# Patient Record
Sex: Male | Born: 1945 | Race: White | Hispanic: No | Marital: Married | Smoking: Never smoker
Health system: Southern US, Community
[De-identification: ages and names within clinical notes are randomized; demographics above are authoritative.]

## PROBLEM LIST (undated history)

## (undated) DIAGNOSIS — R569 Unspecified convulsions: Secondary | ICD-10-CM

## (undated) DIAGNOSIS — C801 Malignant (primary) neoplasm, unspecified: Secondary | ICD-10-CM

## (undated) DIAGNOSIS — I2510 Atherosclerotic heart disease of native coronary artery without angina pectoris: Secondary | ICD-10-CM

## (undated) DIAGNOSIS — E785 Hyperlipidemia, unspecified: Secondary | ICD-10-CM

## (undated) DIAGNOSIS — Z9289 Personal history of other medical treatment: Secondary | ICD-10-CM

## (undated) DIAGNOSIS — Z86018 Personal history of other benign neoplasm: Secondary | ICD-10-CM

## (undated) DIAGNOSIS — I10 Essential (primary) hypertension: Secondary | ICD-10-CM

## (undated) HISTORY — PX: TONSILLECTOMY AND ADENOIDECTOMY: SUR1326

## (undated) HISTORY — PX: NASAL FRACTURE SURGERY: SHX718

## (undated) HISTORY — PX: BRAIN MENINGIOMA EXCISION: SHX576

## (undated) HISTORY — DX: Essential (primary) hypertension: I10

## (undated) HISTORY — PX: NASAL SINUS SURGERY: SHX719

## (undated) HISTORY — DX: Personal history of other benign neoplasm: Z86.018

## (undated) HISTORY — DX: Atherosclerotic heart disease of native coronary artery without angina pectoris: I25.10

## (undated) HISTORY — DX: Hyperlipidemia, unspecified: E78.5

## (undated) HISTORY — PX: FRACTURE SURGERY: SHX138

---

## 2001-02-12 ENCOUNTER — Observation Stay (HOSPITAL_COMMUNITY): Admission: EM | Admit: 2001-02-12 | Discharge: 2001-02-13 | Payer: Self-pay | Admitting: Emergency Medicine

## 2001-02-12 ENCOUNTER — Encounter: Payer: Self-pay | Admitting: Cardiovascular Disease

## 2001-02-15 ENCOUNTER — Inpatient Hospital Stay (HOSPITAL_COMMUNITY): Admission: EM | Admit: 2001-02-15 | Discharge: 2001-02-16 | Payer: Self-pay | Admitting: Emergency Medicine

## 2001-02-15 ENCOUNTER — Encounter: Payer: Self-pay | Admitting: Cardiovascular Disease

## 2004-01-08 ENCOUNTER — Observation Stay (HOSPITAL_COMMUNITY): Admission: EM | Admit: 2004-01-08 | Discharge: 2004-01-09 | Payer: Self-pay | Admitting: Emergency Medicine

## 2004-01-17 DIAGNOSIS — Z9289 Personal history of other medical treatment: Secondary | ICD-10-CM

## 2004-01-17 HISTORY — DX: Personal history of other medical treatment: Z92.89

## 2006-01-02 ENCOUNTER — Ambulatory Visit: Payer: Self-pay | Admitting: Specialist

## 2006-01-16 HISTORY — PX: COLONOSCOPY: SHX174

## 2006-06-26 ENCOUNTER — Ambulatory Visit: Payer: Self-pay | Admitting: Family Medicine

## 2006-07-30 ENCOUNTER — Ambulatory Visit: Payer: Self-pay | Admitting: Family Medicine

## 2006-08-01 ENCOUNTER — Ambulatory Visit: Payer: Self-pay | Admitting: Internal Medicine

## 2006-08-15 ENCOUNTER — Ambulatory Visit: Payer: Self-pay | Admitting: Internal Medicine

## 2007-04-17 ENCOUNTER — Inpatient Hospital Stay (HOSPITAL_COMMUNITY): Admission: EM | Admit: 2007-04-17 | Discharge: 2007-04-18 | Payer: Self-pay | Admitting: Emergency Medicine

## 2007-04-22 ENCOUNTER — Ambulatory Visit: Payer: Self-pay | Admitting: Family Medicine

## 2007-11-26 ENCOUNTER — Ambulatory Visit: Payer: Self-pay | Admitting: Family Medicine

## 2008-03-26 ENCOUNTER — Ambulatory Visit: Payer: Self-pay | Admitting: Family Medicine

## 2008-03-30 ENCOUNTER — Ambulatory Visit: Payer: Self-pay | Admitting: Family Medicine

## 2009-03-19 ENCOUNTER — Ambulatory Visit: Payer: Self-pay | Admitting: Family Medicine

## 2009-04-30 ENCOUNTER — Ambulatory Visit: Payer: Self-pay | Admitting: Family Medicine

## 2009-08-30 ENCOUNTER — Ambulatory Visit: Payer: Self-pay | Admitting: Family Medicine

## 2009-12-18 ENCOUNTER — Inpatient Hospital Stay (HOSPITAL_COMMUNITY): Admission: EM | Admit: 2009-12-18 | Discharge: 2009-12-19 | Payer: Self-pay | Admitting: Emergency Medicine

## 2010-02-08 ENCOUNTER — Ambulatory Visit
Admission: RE | Admit: 2010-02-08 | Discharge: 2010-02-08 | Payer: Self-pay | Source: Home / Self Care | Attending: Family Medicine | Admitting: Family Medicine

## 2010-02-22 ENCOUNTER — Observation Stay (HOSPITAL_COMMUNITY)
Admission: EM | Admit: 2010-02-22 | Discharge: 2010-02-24 | DRG: 101 | Disposition: A | Discharge status: Home / Self Care | Payer: 59 | Attending: Internal Medicine | Admitting: Internal Medicine

## 2010-02-22 ENCOUNTER — Emergency Department (HOSPITAL_COMMUNITY): Payer: 59

## 2010-02-22 DIAGNOSIS — E871 Hypo-osmolality and hyponatremia: Secondary | ICD-10-CM | POA: Diagnosis present

## 2010-02-22 DIAGNOSIS — I10 Essential (primary) hypertension: Secondary | ICD-10-CM | POA: Diagnosis present

## 2010-02-22 DIAGNOSIS — R279 Unspecified lack of coordination: Secondary | ICD-10-CM | POA: Diagnosis present

## 2010-02-22 DIAGNOSIS — T502X5A Adverse effect of carbonic-anhydrase inhibitors, benzothiadiazides and other diuretics, initial encounter: Secondary | ICD-10-CM | POA: Diagnosis present

## 2010-02-22 DIAGNOSIS — T420X5A Adverse effect of hydantoin derivatives, initial encounter: Secondary | ICD-10-CM | POA: Diagnosis present

## 2010-02-22 DIAGNOSIS — G40909 Epilepsy, unspecified, not intractable, without status epilepticus: Principal | ICD-10-CM | POA: Diagnosis present

## 2010-02-22 LAB — CBC
HCT: 42.9 % (ref 39.0–52.0)
MCHC: 35.9 g/dL (ref 30.0–36.0)
MCV: 82 fL (ref 78.0–100.0)
Platelets: 213 10*3/uL (ref 150–400)
RDW: 13.6 % (ref 11.5–15.5)

## 2010-02-22 LAB — BASIC METABOLIC PANEL
BUN: 6 mg/dL (ref 6–23)
Creatinine, Ser: 0.68 mg/dL (ref 0.4–1.5)
GFR calc non Af Amer: >60 mL/min (ref >60)
Glucose, Bld: 111 mg/dL — ABNORMAL HIGH (ref 70–99)
Sodium: 130 mEq/L — ABNORMAL LOW (ref 135–145)

## 2010-02-22 LAB — PHENYTOIN LEVEL, TOTAL: Phenytoin Lvl: 9.1 ug/mL — ABNORMAL LOW (ref 10.0–20.0)

## 2010-02-23 LAB — POCT I-STAT, CHEM 8
HCT: 48 % (ref 39.0–52.0)
Hemoglobin: 16.3 g/dL (ref 13.0–17.0)

## 2010-02-24 LAB — BASIC METABOLIC PANEL
BUN: 5 mg/dL — ABNORMAL LOW (ref 6–23)
Potassium: 4.1 mEq/L (ref 3.5–5.1)
Sodium: 135 mEq/L (ref 135–145)

## 2010-02-27 NOTE — H&P (Signed)
NAMEFATE, CASTER NO.:  [id number]  MEDICAL RECORD NO.:  [id number]           PATIENT TYPE:  E  LOCATION:  [facility]                         FACILITY:  [facility]  PHYSICIAN:  Houston Siren, MD           DATE OF BIRTH:  October 09, 1945  DATE OF ADMISSION:  02/22/2010 DATE OF DISCHARGE:                             HISTORY & PHYSICAL   PRIMARY CARE PHYSICIAN:  Sharlot Gowda, MD.  ADVANCE DIRECTIVE:  Full code.  REASON FOR ADMISSION:  Ataxia.  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  He reportedly had a brief shaking movement without loss of consciousness, but subsequently was confused and somnolent.  He stated these brief episodes are what he had in the past and was thought to be seizure, although his prior physician never told him that these were true seizures. Recently, his Dilantin dose was reduced.  Workup in the emergency room included a Dilantin level of 9.1, and a serum sodium of 130 with normal creatinine and normal white count.  His head CT shows stable post craniotomy compared to December 2011, along with evidence of chronic sinusitis.  He felt no vertigo.  Attempting to go home, however, he felt weak and ataxic when he tried to ambulate.  Because of his unsteadiness, Hospitalist was asked to admit the patient for observation and to be sure he does not have any fall risk.  PAST MEDICAL HISTORY: 1. Hypertension. 2. Brain tumor. 3. Seizure disorder status post craniotomy.  SOCIAL HISTORY:  He lives with his wife.  He is not a smoker and denies any alcohol or drug use.  ALLERGIES:  Reglan.  CURRENT MEDICATIONS:  Dilantin, hydrochlorothiazide, and Toprol-XL.  REVIEW OF SYSTEMS:  Otherwise unremarkable.  He has no headache  or visual problem.  He complained of problem judging distance.  PHYSICAL EXAMINATION:  VITAL SIGNS:  Blood pressure 160/100, pulse is 74, respiratory rate of 18, temperature 98.2. HEENT:  He is an alert and oriented and conversing.  His speech is fluent.  Tongue is midline.  He has facial symmetry. NECK:  Supple.  No carotid bruit. CARDIAC:  S1 and S2, regular.  I did not hear murmur, rub, or gallop. LUNGS:  Clear. NEUROLOGIC:  He has good strength bilaterally.  Cerebellum exam is intact.  He is able to stand and ambulate for me without assistance, however, he does appear slightly ataxic.  His strength is equal bilaterally and he has no cogwheel rigidities.  I did not witness any 3- 5 Hz tremor.  OBJECTIVE FINDINGS:  Serum sodium 130, glucose 111, creatinine 0.68. Dilantin 9.1.  White count of 8,700, hemoglobin of 15.4.  Head CT showed no changes since December 18, 2009, with evidence of chronic sinusitis.  IMPRESSION:  This is a 65 year old male with history of brain tumor status post craniotomy along with history of seizure on subtherapeutic Dilantin level.  PLAN:  I will increase his Dilantin to 250 mg b.i.d., although I am not convinced that these were real seizures.  His degree of hyponatremia of 130 is unlikely to cause ataxia, and it is most likely because of the hydrochlorothiazide.  We will hold diuretic and give normal saline at 75 mL an hour.  I suspect that with this, his serum sodium will correct.  We would like to get physical therapy to evaluate the safety of his gait.  We will admit him to the [facility].  He is a full code.     Houston Siren, MD     PL/MEDQ  D:  02/23/2010  T:  02/23/2010  Job:  [id number]  Electronically Signed by Houston Siren  on 02/27/2010 05:29:54 AM

## 2010-03-28 LAB — URINALYSIS, ROUTINE W REFLEX MICROSCOPIC
Bilirubin Urine: NEGATIVE
Hgb urine dipstick: NEGATIVE
Ketones, ur: NEGATIVE mg/dL
Protein, ur: NEGATIVE mg/dL
Urobilinogen, UA: 0.2 mg/dL (ref 0.0–1.0)
pH: 7.5 (ref 5.0–8.0)

## 2010-03-28 LAB — POCT I-STAT, CHEM 8
Calcium, Ion: 1.12 mmol/L (ref 1.12–1.32)
Chloride: 96 mEq/L (ref 96–112)
Potassium: 3.4 mEq/L — ABNORMAL LOW (ref 3.5–5.1)
Sodium: 133 mEq/L — ABNORMAL LOW (ref 135–145)

## 2010-03-28 LAB — POCT CARDIAC MARKERS
CKMB, poc: 6.1 ng/mL (ref 1.0–8.0)
CKMB, poc: 9.8 ng/mL (ref 1.0–8.0)
Troponin i, poc: <0.05 ng/mL (ref 0.00–0.09)
Troponin i, poc: <0.05 ng/mL (ref 0.00–0.09)

## 2010-03-28 LAB — DIFFERENTIAL
Basophils Absolute: 0.1 10*3/uL (ref 0.0–0.1)
Lymphocytes Relative: 35 % (ref 12–46)
Lymphs Abs: 2.3 10*3/uL (ref 0.7–4.0)
Neutro Abs: 3.4 10*3/uL (ref 1.7–7.7)

## 2010-03-28 LAB — PHENYTOIN LEVEL, TOTAL: Phenytoin Lvl: 11.4 ug/mL (ref 10.0–20.0)

## 2010-03-28 LAB — CBC
Hemoglobin: 15.8 g/dL (ref 13.0–17.0)
MCH: 28.3 pg (ref 26.0–34.0)
MCHC: 35.1 g/dL (ref 30.0–36.0)
Platelets: 212 10*3/uL (ref 150–400)
RDW: 13.9 % (ref 11.5–15.5)

## 2010-03-28 LAB — CK TOTAL AND CKMB (NOT AT ARMC)
CK, MB: 15.6 ng/mL (ref 0.3–4.0)
Total CK: 475 U/L — ABNORMAL HIGH (ref 7–232)

## 2010-03-28 LAB — BASIC METABOLIC PANEL
CO2: 23 mEq/L (ref 19–32)
Chloride: 92 mEq/L — ABNORMAL LOW (ref 96–112)
GFR calc non Af Amer: >60 mL/min (ref >60)
Glucose, Bld: 111 mg/dL — ABNORMAL HIGH (ref 70–99)
Potassium: 3.5 mEq/L (ref 3.5–5.1)
Sodium: 131 mEq/L — ABNORMAL LOW (ref 135–145)

## 2010-04-14 ENCOUNTER — Ambulatory Visit (INDEPENDENT_AMBULATORY_CARE_PROVIDER_SITE_OTHER): Payer: 59 | Admitting: Family Medicine

## 2010-04-14 DIAGNOSIS — I10 Essential (primary) hypertension: Secondary | ICD-10-CM

## 2010-04-14 DIAGNOSIS — Z79899 Other long term (current) drug therapy: Secondary | ICD-10-CM

## 2010-05-31 NOTE — H&P (Signed)
NAMETOWNSEND, CUDWORTH NO.:  [id number]   MEDICAL RECORD NO.:  [id number]          PATIENT TYPE:  INP   LOCATION:  2033                         FACILITY:  [facility]   PHYSICIAN:  Della Goo, M.D. DATE OF BIRTH:  01-06-1946   DATE OF ADMISSION:  04/17/2007  DATE OF DISCHARGE:                              HISTORY & PHYSICAL   PRIMARY CARE PHYSICIAN:  Dr. Susann Givens   CHIEF COMPLAINT:  Passed out.   HISTORY OF PRESENT ILLNESS:  This is a 65 year old male who was brought  to the emergency department after suffering two syncopal episodes at  home prior to admission.  His wife reports that he passed out and was  out for a few minutes.  The patient denies any recall of the episodes.  He denies having any previous prodrome or aura prior to passing out.  He  denies having any chest pain, shortness of breath or dizziness.  The  patient did also begin to have vomiting afterward, one episode at home  and one episode while in the emergency department.  He reports being  nauseous.  He denies having any hematemesis.   The patient was evaluated in the emergency department and a CT scan of  the head was performed which did reveal abnormal findings.  The patient  has findings of possible recurrence of the meningioma.  He has a history  of a meningioma that was diagnosed in December 2005 and he had an MRI  performed at [facility] at that time and was evaluated by Dr.  Glee Arvin.  However, he was living in [place], [place] at the time and  had his surgery performed in [place], [place] in March 2006.  The  patient denies having any complications or problems after this surgery.  He reports his reason for the evaluation at that time of the initial  finding of the meningioma was that he suffered a focal seizure with  shaking of the right lower extremity.  When the imaging studies were  performed, they did reveal a meningioma.  The patient reports having  numbness and  intermittent paresthesias in the right foot recently.   PAST MEDICAL HISTORY:  Significant for hypertension and that mentioned  above.   PAST SURGICAL HISTORY:  History of meningioma resection.   MEDICATIONS:  Ziac 5 mg/6.25 one tablet p.o. daily.   ALLERGIES:  NO KNOWN DRUG ALLERGIES.   SOCIAL HISTORY:  The patient is a nonsmoker and he drinks one glass of  red wine nightly for health purposes.   PHYSICAL EXAMINATION FINDINGS:  GENERAL APPEARANCE:  This is a pleasant,  65 year old, well-nourished, well-developed male in no discomfort or  acute distress at this time.  VITAL SIGNS:  His vital signs initially were temperature 97.0, blood  pressure 126/71, heart rate 68, respirations 16 and O2 saturations 97%  on room air.  HEENT EXAMINATION:  Normocephalic, atraumatic.  Pupils are equally round  and reactive to light.  Extraocular muscles are intact.  Funduscopic are  benign.  Oropharynx is clear.  NECK:  Supple.  Full range of motion.  No thyromegaly, adenopathy  or  jugular venous distention.  CARDIOVASCULAR:  Regular rate and rhythm.  No murmurs, gallops or rubs.  LUNGS:  Clear to auscultation bilaterally.  ABDOMEN:  Positive bowel sounds.  Soft, nontender and nondistended.  EXTREMITIES:  Without cyanosis, clubbing or edema.  NEUROLOGIC EXAMINATION:  Alert and oriented x3.  Cranial nerves are  intact.  There are no deficits of motor function, sensation or any other  focal findings on examination.   LABORATORY STUDIES:  Hemoglobin on the i-STAT is 16.0, hematocrit 47.0,  sodium 137, potassium 3.2, chloride 102, bicarb 23, BUN 9, creatinine  1.1, glucose 117.  The i-STAT cardiac markers reveal a myoglobin of  40.9, CK-MB 2.1, troponin less than 0.05.  CT scan of the head without  contrast reveals chronic perisinusitis with acute maxillary and possible  acute ethmoid sinusitis, dense soft tissue finding in the left side of  the posterior falx suggestive of residual or recurrent  parafalcine  meningioma, adjacent white matter hypodensity also seen in the left  parietal lobe which may reflect gliosis or vasogenic edema.  An MRI of  the brain was suggested for further definition.   ASSESSMENT:  A 65 year old male being admitted with:  1. Syncopal episode.  2. Brain tumor.  3. Chronic diffuse sinusitis.  4. Mild hypokalemia.  5. History of hypertension.   PLAN:  The patient will be admitted to a telemetry area for cardiac  monitoring.  Cardiac enzymes will be continued.  The patient is  currently undergoing an MRI/MRA study of the brain for further  characterization of the brain tumor.  The patient will be placed on fall  precautions and continued on his regular medications.  Oral potassium  therapy has been given for repletion.  DVT prophylaxis with TED hose has  been ordered for now and GI prophylaxis as well.  A Neurosurgery  consultation will be requested.      Della Goo, M.D.  Electronically Signed     HJ/MEDQ  D:  04/17/2007  T:  04/17/2007  Job:  [id number]   cc:   Sharlot Gowda, M.D.

## 2010-05-31 NOTE — Procedures (Signed)
EEG NUMBER:  [id number].   This is a 65 year old man with a history of syncope, hypokalemia,  sinusitis, seizures. Found passed out at home.  Has a history of a  meningioma status post craniotomy for such in March 2006.  Also has  hypertension.   MEDICATIONS LISTED:  Include:  1. Reglan.  2. Zofran.  3. Potassium.  4. Dilantin.  5. Unison.  6. Ziac.  7. Protonix.  8. Tylenol.  9. Clonidine.  10.Dilaudid.  11.Oxycodone.   This was a routine 17-channel EEG with 1 channel devoted to EKG  utilizing [employer] 10/20 lead placement system.  The patient was  described clinically as being awake and alert and electrographically  appeared be awake throughout the recording with the exception of falling  asleep towards the very end.  The background consisted of a somewhat  disorganized 9 Hz alpha activity with some superimposed beta,  particularly centrally over the craniotomy site.  No clear  interhemispheric asymmetry is otherwise identified, and no definite  epileptiform discharges are seen.  Hyperventilation and photic  stimulation were performed but did not produce any significant change in  the background activity.  The EKG monitor reveals a relatively regular  rhythm with a rate of 84 beats per minute.   CONCLUSION:  Essentially normal awake and asleep EEG with some increased  sharp activity and beta seen over the site of the craniotomy.  No  definite seizure activity was seen during the course of the today's  recording.  Clinical correlation is recommended.      Catherine A. Orlin Hilding, M.D.  Electronically Signed     BJY:NWGN  D:  04/17/2007 17:18:01  T:  04/18/2007 08:01:45  Job #:  [id number]

## 2010-06-03 NOTE — H&P (Signed)
[place]. [facility]  Patient:    Russell Dudley, Russell Dudley Visit Number: [id number] MRN: [id number]          Service Type: MED Location: 1800 1830 01 Attending Physician:  Doug Sou Dictated by:   Abelino Derrick, P.A.C. Admit Date:  02/12/2001                           History and Physical  CHIEF COMPLAINT:  Chest pain.  HISTORY OF PRESENT ILLNESS:  Patient is a 65 year old male with no prior history of coronary disease who was sent to the emergency room from his primary care doctors office.  Patient developed substernal chest pain described as tightness across his chest while in the shower this morning.  He has had some radiation to his neck.  Symptoms are associated with weakness but no nausea and vomiting or shortness of breath.  In the emergency room, he says his symptoms may be worse with movement.  He took Motrin at home without relief.  He took a nitroglycerin at his primary care doctors office without relief.  He continues to have vague symptoms now that sound more atypical.  He has been walking four to five miles a day without pain.  PAST MEDICAL HISTORY:  His past medical history is remarkable for previous surgery on his nose and cheek after a softball injury.  There is no history of diabetes or hypertension and he is unsure of his lipid status.  MEDICATIONS:  He has no medicines.  ALLERGIES:  He has no known drug allergies.  SOCIAL HISTORY:  He is married.  Currently, he is unemployed.  He is a nonsmoker but does chew tobacco.  He has three children and two grandchildren.  FAMILY HISTORY:  Family history is remarkable in that his father had bypass at 53 and his brother had a bypass in his 33s.  REVIEW OF SYSTEMS:  Essentially unremarkable except for noted above.  There is no history of GI bleeding or peptic ulcer disease.  He has no history of thyroid disease or kidney problems.  He has had some "indigestion" over the last few  days.  PHYSICAL EXAMINATION:  VITAL SIGNS:  Blood pressure 163/107 in the emergency room, pulse 70, respirations 16.  GENERAL:  He is a well-developed, well-nourished male in no acute distress.  HEENT:  Normocephalic.  Extraocular movements are intact.  Sclerae nonicteric. Lids and conjunctivae are within normal limits.  He does wear glasses.  NECK:  Without bruit and without JVD.  CHEST:  Clear to auscultation and percussion.  CARDIAC:  Regular rate and rhythm without murmur, rub or gallop.  Normal S1 and S2.  ABDOMEN:  Nontender.  No hepatosplenomegaly.  EXTREMITIES:  Without edema.  Distal pulses are 2+/4.  There are no femoral artery bruits noted.  NEUROLOGIC:  Exam is grossly intact.  He is awake, alert, oriented and cooperative, moves all extremities without obvious deficit.  LABORATORY AND ACCESSORY DATA:  His EKG shows sinus rhythm without acute changes.  IMPRESSION: 1. Chest pain and unstable angina, some typical and some atypical symptoms. 2. Positive family history of early coronary disease. 3. Hypertension in the emergency room.  PLAN:  Patient is to be admitted, started on aspirin, beta blocker, heparin and ruled out for MI.  If he rules out, he can probably have an outpatient stress Cardiolite study; if any recurrent pain or EKG changes or positive enzymes, he will need catheterization. Dictated by:  Abelino Derrick, P.A.C. Attending Physician:  Doug Sou DD:  02/12/01 TD:  02/12/01 Job: [id number]

## 2010-06-03 NOTE — Discharge Summary (Signed)
[name]. [facility]  Patient:    Russell Dudley, Russell Dudley Visit Number: [id number] MRN: [id number]          Service Type: MED Location: 6500 [id number] [id number] Attending Physician:  Berry, Jonathan [place] Dictated by:   Raymon Mutton, P.A. Admit Date:  02/15/2001 Discharge Date: 02/16/2001                             Discharge Summary  INCOMPLETE  DATE OF BIRTH:  Jun 03, 1945  DISCHARGE DIAGNOSES: 1. Coronary artery disease, status post coronary angiography this admission on    February 15, 2001. 2. Hyperlipidemia. 3. Hypertension. 4. Anxiety. 5. Significant family history of coronary artery disease.  HISTORY OF PRESENT ILLNESS:  Russell Dudley is a 65 year old, married, Caucasian gentleman with family history of coronary artery disease who presented to [facility] with atypical chest pain accompanied by increased level of anxiety with restlessness and extreme concern of his heart situation.  He was seen prior to this with visit to the emergency room on February 12, 2001, with atypical chest pain that ruled out for MI.  He did not have any acute changes and serial enzymes were negative at that time.  He was scheduled for an outpatient Cardiolite stress test and was advised to avoid physical activity prior to that test and until the results become available.  Today, he presents to the emergency room with atypical chest pain.  After assessment of the patient, the decision was made to perform diagnostic catheterization the same day.  This catheterization revealed normal left main artery and normal left anterior descending artery.  Left circumflex artery was normal.  The ramus intermedius branch was a modified vessel and had an 80% segmental proximal stenosis.  The right coronary artery was normal.  Left ventricular ejection fraction was estimated at greater than 60% without any focal wall motion abnormalities.  After Dictated by:   Raymon Mutton,  P.A. Attending Physician:  Berry, Jonathan [place] DD:  03/15/01 TD:  03/15/01 Job: [id number]

## 2010-06-03 NOTE — Discharge Summary (Signed)
[place]. [facility]  Patient:    ANTONY, SIAN Visit Number: [id number] MRN: [id number]          Service Type: MED Location: 6500 6524 [id number] Attending Physician:  Berry, Jonathan [place] Dictated by:   Raymon Mutton, P.A. Admit Date:  02/15/2001 Discharge Date: 02/16/2001                             Discharge Summary  DATE OF BIRTH:  [birth year]  DISCHARGE DIAGNOSES: 1. Coronary artery disease, status post coronary angiography by Dr. Allyson Sabal on    February 15, 2001. 2. Hyperlipidemia. 3. Hypertension. 4. Anxiety. 5. Positive family history of coronary artery disease.  HISTORY OF PRESENT ILLNESS:  Mr. Cobbins is a 65 year old Caucasian gentleman who presented to the emergency room with complaints of substernal chest pain, burning in the chest, which sounded somewhat atypical.  He was seen three days ago on February 12, 2001, in the emergency room with similar complaints, and was discharged home with follow up with outpatient Cardiolite stress test and avoid physical activity up until the results of the test became available.  Today he experienced severe chest pain and became very anxious, nervous, and his wife stated that he is not himself since his discharge home.  He never can relax, and is very anxious.  Meanwhile, the patient denied shortness of breath, denied nausea, diaphoresis.  Pain occurred when he was at rest, and included ______, but he could not ______ by certain body movements and positioning. Dictated by:   Raymon Mutton, P.A. Attending Physician:  Berry, Jonathan [place] DD:  03/15/01 TD:  03/15/01 Job: [id number]
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
## 2010-06-03 NOTE — Discharge Summary (Signed)
[place]. [facility]  Patient:    Russell Dudley, Russell Dudley Visit Number: [id number] MRN: [id number]          Service Type: MED Location: [phone] Attending Physician:  Berry, Jonathan [place] Dictated by:   [facility], [facility]C. Admit Date:  02/12/2001 Discharge Date: 02/13/2001   CC:         Ronnald Nian, M.D.  Runell Gess, M.D.   Discharge Summary  ADMITTING DIAGNOSES: 1. Atypical chest pain. 2. Hypertension newly diagnosed. 3. Family history of cardiovascular disease.  DISCHARGE DIAGNOSES: 1. Atypical chest pain, status post negative cardiac enzymes and    electrocardiogram without significant change.  Plan for outpatient    Cardiolite stress test. 2. Hypertension newly diagnosed. 3. Family history of cardiovascular disease. 4. Hyperlipidemia.  HISTORY OF PRESENT ILLNESS:  Russell Dudley is a 65 year old white male with no prior history of CAD.  On February 12, 2001, he was sent to [facility] Emergency Room by his primary care physician secondary to complaints of chest pain.  He described a substernal chest pain that was a tightness across the chest.  He had experienced this tightness while in the shower that morning.  The symptoms were associated with weakness but no nausea, vomiting, and no shortness of breath.  There was some radiation of the pain to the back. He was not certain whether it was worse with movement.  There was no change with motion at home and no change after taking nitroglycerin as the primary care physicians office.  At that time in the ER, he was having vague symptoms.  He had been walking four to five miles a day without problems.  No chest pain until that day of admission.  On exam at that time, he was hypertensive with a blood pressure of 163/107, pulse of 70.  No significant findings on physical examination.  EKG showed normal sinus rhythm with no acute change.  At that time, he was seen and  evaluated by Dr. Runell Gess.  It was felt that he was pain-free at that point, except for when he moved his neck.  At that time, it was felt that he most likely had musculoskeletal chest pain, but given his family history and now elevated blood pressure, we would admit, check serial enzymes to rule out MI, place him on NSAID, proton pump inhibitor, as well as beta blocker, and aspirin.  If this evaluation of enzymes and follow-up EKGs remained stable, he could be discharged home the following day.  HOSPITAL COURSE:  On February 13, 2001, Russell Dudley says he only has a few mild chest twinges but no other chest discomfort.  His Lopressor had been held secondary to low heart rate.  Labs were stable.  Cardiac enzymes were all negative.  LDL was elevated at 168 on lipid profile.  Blood pressure was improved and was now at 132/84 despite getting no beta blocker.  His exam remains benign.  At this time, he was seen and evaluated by Dr. Delrae Rend who deemed him stable for discharge home.  He was then placed on Altace for his hypertension and Lipitor for his hyperlipidemia.  He is being placed on Vioxx for musculoskeletal pain and proton pump inhibitor for possible GERD.  CONSULTANTS:  None.  PROCEDURES:  None.  LABORATORY DATA:  Cardiac enzymes are negative with CKs of 296, 259, 209. CK-MB of 3.2, 2.4, and 2.0.  Troponin is less than 0.01 x 3.  Lipid profile shows total cholesterol of 257, triglycerides of 154, HDL 58, LDL 168.  On admission, WBC 8.1, hemoglobin 16.1, hematocrit 44.6, platelet 236,000.  PT 13.1, INR 1.0, PTT 33.  Sodium 134, potassium 3.9, BUN 6, creatinine 0.9, glucose 109.  All these values remained stable throughout the hospitalization.  EKG on admission showed normal sinus rhythm, nonspecific ST and T-wave change. Telemetry showed sinus rhythm and sinus bradycardia but no significant arrhythmia.  DISCHARGE MEDICATIONS: 1. Enteric-coated aspirin 325 mg 1  q.d. 2. Lipitor 10 mg 1 q.d. 3. Altace 2.5 mg 1 q.d. 4. Vioxx 25 mg 1 q.d. 5. Protonix 40 mg 1 q.d. 6. Nitroglycerin 0.4 mg sublingual p.r.n. chest pain.  ACTIVITY:  No strenuous activity until you see Dr. Runell Gess back in the office.  DIET:  Low cholesterol diet.  FOLLOW-UP:  Call [phone] if any problems, questions, or increasing chest pain.  He is scheduled for an exercise Cardiolite on Friday, February 22, 2001 at 11 a.m. on the second floor.  He is scheduled to see Dr. Runell Gess on Monday, February 25, 2001 at 2 p.m. on the third floor.  We have given him instructions for preparation of his stress test. Dictated by:   [facility] [place], P.[facility].-C. Attending Physician:  Berry, Jonathan [place]il[place] DD:  02/13/01 TD:  02/14/01 Job: [id number]

## 2010-06-03 NOTE — Discharge Summary (Signed)
Russell Dudley, Russell Dudley NO.:  [id number]   MEDICAL RECORD NO.:  [id number]          PATIENT TYPE:  INP   LOCATION:  2033                         FACILITY:  [facility]   PHYSICIAN:  Lonia Blood, M.D.       DATE OF BIRTH:  04/23/1945   DATE OF ADMISSION:  04/17/2007  DATE OF DISCHARGE:  04/18/2007                               DISCHARGE SUMMARY   PRIMARY CARE PHYSICIAN:  Sharlot Gowda, MD   DISCHARGE DIAGNOSES:  1. Seizure disorder, most likely secondary to meningioma.  2. Meningioma, status post partial resection with white matter      edematous gliotic changes in the parieto-occipital region, which      appears stable as compared to an MRI that was done in October 2008      in [place], [place].  3. Hypertension.  4. Hyperlipidemia.   DISCHARGE MEDICATIONS:  1. Ziac daily.  2. Dilantin 300 mg at bedtime.   CONDITION ON DISCHARGE:  [name] was discharged in good condition.  He will follow up with his primary physician, Dr. Susann Givens for a Dilantin  level check.   PROCEDURE:  1. During this admission on April 17, 2007, MRI of the brain, which      indicated stable meningioma in the parieto-occipital region.  2. On April 17, 2007, EEG which is essentially normal awake and asleep      EEG with some increased sharp activity better seen over the side of      the craniotomy but without any definite seizure activity.   HISTORY AND PHYSICAL:  Refer to dictated H&P which was done by Dr.  Della Goo on April 17, 2007.   HOSPITAL COURSE:  1. Syncopal event.  [name] was observed on telemetry.  He did not      have any arrhythmias.  The patient had 3 sets of cardiac enzymes,      which did not indicate any myocardial injury.  Given the patient's      history of meningioma and history of seizures, we strongly suspect      that the episode of syncope was in fact a seizure.  [name]      was loaded with Dilantin and he was started back on Dilantin 300      grams  at bedtime.  He will follow up with his primary care      physician for further adjustment of his medication.  2. Meningioma.  I have offered [name] the option of seeing a local      neurosurgeon to discuss further resection for his meningioma.  The      patient politely refused and he states that he is going to return      to his      primary neurosurgeon in [place], [place].  3. Hypokalemia secondary to the hydrochlorothiazide component of Ziac.      This was repleted intravenously, initially, and then orally      subsequently.      Lonia Blood, M.D.  Electronically Signed     SL/MEDQ  D:  04/23/2007  T:  04/24/2007  Job:  [id number]   cc:   Sharlot Gowda, M.D.

## 2010-06-03 NOTE — Cardiovascular Report (Signed)
[place]. [facility]  Patient:    Russell Dudley, MEAS Visit Number: [id number] MRN: [id number]          Service Type: MED Location: [id number] Attending Physician:  Berry, Jonathan [place] Dictated by:   Runell Gess, M.D. Proc. Date: 02/15/01 Admit Date:  02/15/2001 Discharge Date: 02/16/2001   CC:         Second Floor [facility]  Ronnald Nian, M.D.  [facility], [address] [place], [place] [address]                        Cardiac Catheterization  INDICATIONS:  Mr. Michiel Cowboy is a 65 year old married white male with essentially no cardiac risk factors, who presented to [facility] February 12, 2001, with chest pain.  He ruled out for myocardial infarction and there were no EKG changes.  He was discharged home and scheduled to have an outpatient Cardiolite.  He presents today for atypical chest pain.  There were no EKG changes.  Diagnostic coronary angiogram was recommended to rule out ischemic etiology.  PROCEDURE DESCRIPTION:  The patient was brought to the second floor [facility] in the postabsorptive state.  He was premedicated with p.o. Valium and IV Versed.  His right groin was prepped and shaved in the usual sterile fashion.  One percent Xylocaine was used for local anesthesia.  A 6-French sheath was inserted into the right femoral artery using the standard Seldinger technique.  Six French right and left Judkins diagnostic catheters, along with a 6-French pigtail catheter were used for selective coronary angiography, left ventriculography, and supravalvular aortography in the LAO cranial view.  Omnipaque dye was used for the entirety of the case. Retrograde aortic, left ventricular, and pullback pressures were recorded.  HEMODYNAMICS: 1. Aortic systolic pressure 141, diastolic pressure 83. 2. Left ventricular systolic pressure 141,  end-diastolic pressure 12.  SELECTIVE CORONARY ANGIOGRAPHY: 1. Left main:  Normal. 2. Left anterior descending:  Normal. 3. Left circumflex:  Codominant and normal. 4. Ramus intermediate branch:  This was a moderate sized vessel and had an 80%    segmental proximal stenosis. 5. Right coronary artery:  This was a codominant vessel and was normal.  LEFT VENTRICULOGRAPHY:  RAO left ventriculogram was performed using 20 cc of Omnipaque dye at 10 cc/sec.  The overall LV EF was estimated at greater than 60%, without focal wall motion abnormalities.  SUPRAVALVULAR AORTOGRAPHY:  This was performed in the LAO cranial view, looking at the ascending aorta arch and the great vessels.  There was no evidence of dissection.  All great vessels were intact.  IMPRESSION:  Mr. Michiel Cowboy has branch vessel disease in a medium sized ramus branch/high obtuse marginal branch.  PLAN:  Because of its proximity to the left main and the small size of the vessel (less than 2 mm), I prefer medical therapy at this point.  I am going to start him on Norvasc and obtain an outpatient Cardiolite to look for inducible ischemia.  I will see him back in the office the day after this to review his clinical response and functional data.  If he continues to have chest pain and is ischemic, he may be a candidate for cutting balloon angioplasty of the proximal ramus intermedius branch.  The guide catheter was removed.  The sheath was removed and the right femoral artery puncture site was hemostatically sealed with the Perclose  suture closure device.  The patient left the laboratory in stable condition.  Dr. Garlan Fair office was notified of these results. Dictated by:   Runell Gess, M.D. Attending Physician:  Berry, Jonathan [place]az[place] DD:  02/15/01 TD:  02/17/01 Job: [id number]

## 2010-06-03 NOTE — Discharge Summary (Signed)
[place]. [facility]  Patient:    Russell Dudley, Russell Dudley Visit Number: [id number] MRN: [id number]          Service Type: MED Location: [id number] Attending Physician:  Berry, Jonathan [place] Dictated by:   Raymon Mutton, P.A. Admit Date:  02/15/2001 Discharge Date: 02/16/2001                             Discharge Summary  DISCHARGE DIAGNOSES: 1. Coronary artery disease, status post coronary angiography during this    admission. 2. Hyperlipidemia. 3. Hypertension. 4. Anxiety. 5. Family history of coronary artery disease.  HISTORY OF PRESENT ILLNESS:  Russell Dudley is a 65 year old Caucasian gentleman, a patient of Dr. Allyson Sabal, who was seen in the emergency room with complaints of substernal chest pain, and burning without any associated shortness of breath, nausea, or diaphoresis.  He said that the pain occurred at rest, but could be provoked by any certain way of moving or positioning.  He was seen seen in the emergency room on February 12, 2001.  He was seen at [facility] on February 12, 2001, and at that time was ruled out for myocardial infarction, and discharged home with a plan to follow up with an outpatient Cardiolite stress test.  The patient developed chest pain early this morning, and became very anxious, nervous, could not relax, and his wife took him to the emergency room for assessment.  After assessing the patient, Dr. Allyson Sabal decided to proceed with diagnostic cardiac catheterization.  HOSPITAL PROCEDURES:  Cardiac catheterization was performed by Dr. Allyson Sabal on February 15, 2001.  It showed normal left anterior descending artery, normal left main, codominant normal left circumflex, and codominant normal right coronary artery.  He had a 90% segmental stenosis of the ramus intermediate branch.  After reviewing films, decided to proceed with the medical therapy and start the patient on Norvasc, and obtain outpatient Cardiolite stress  test to rule out ______ ischemia because of the small size of the vessel, less than 10 mm in diameter, and because of its proximity to the left main artery. The patient tolerated the procedure well, was transferred to the telemetry floor in stable condition.  LABORATORY DATA:  EKG showed sinus bradycardia, otherwise normal EKG without any ischemic changes.  Chest x-ray showed no active disease in the chest, but there appears to be slight improvement in the overall aeration compared to the previous study on February 12, 2001.  His white blood cell count was 6.4, hemoglobin 14.4, hematocrit 41.3, platelets 214.  Potassium 4.0, sodium 136, BUN 5, creatinine 1.0.  Cardiac panel was totally negative.  DISCHARGE MEDICATIONS: 1. Norvasc 5 mg q.d. 2. Altace 2.5 mg q.d. 3. Lipitor 20 mg q.d. 4. Aspirin 325 mg q.d. 5. Protonix 40 mg q.d. 6. Vioxx 25 mg q.d. 7. Nitroglycerin 0.4 mg sublingual.  DISCHARGE ACTIVITY:  No driving, no heavy lifting greater than 5 pounds, and no sexual activity or strenuous activity for three days after the cath.  DISCHARGE DIET:  Low fat, low cholesterol, low sodium diet.  WOUND CARE:  The patient was instructed to report worsening complications of the cath, such as swelling, oozing, bleeding, or increased pain.  He is allowed to shower, besides rubbing the puncture site.  FOLLOWUP:  He was advised to keep all his scheduled appointments, including Cardiolite stress test on Friday, February 22, 2001, and after that he will be seen by Dr. Allyson Sabal. Dictated  by:   Raymon Mutton, P.A. Attending Physician:  Berry, Jonathan [place] DD:  03/15/01 TD:  03/15/01 Job: [id number]

## 2010-10-11 LAB — CARDIAC PANEL(CRET KIN+CKTOT+MB+TROPI)
Relative Index: 1.8
Relative Index: 2.4
Total CK: 109
Troponin I: 0.01
Troponin I: <0.01

## 2010-10-11 LAB — POCT I-STAT, CHEM 8
BUN: 9
Calcium, Ion: 1.07 — ABNORMAL LOW
Chloride: 102
Creatinine, Ser: 1.1
Glucose, Bld: 117 — ABNORMAL HIGH
HCT: 47
Hemoglobin: 16
Potassium: 3.2 — ABNORMAL LOW
Sodium: 137
TCO2: 23

## 2010-10-11 LAB — POCT CARDIAC MARKERS
CKMB, poc: 2.1
Myoglobin, poc: 40.9
Operator id: 270651

## 2010-10-11 LAB — CK TOTAL AND CKMB (NOT AT ARMC)
CK, MB: 5.1 — ABNORMAL HIGH
Relative Index: 3.2 — ABNORMAL HIGH
Total CK: 158

## 2010-10-11 LAB — TSH: TSH: 1.919

## 2010-11-07 ENCOUNTER — Telehealth: Payer: Self-pay | Admitting: Family Medicine

## 2010-11-07 MED ORDER — FEXOFENADINE HCL 180 MG PO TABS: 180.0000 mg | ORAL_TABLET | Freq: Every day | ORAL | Status: DC

## 2010-11-07 NOTE — Telephone Encounter (Signed)
Needs refill Fexofenadine HCL 180 mg  90 day supply  [facility]

## 2010-11-07 NOTE — Telephone Encounter (Signed)
Fexofenadine Emailed

## 2011-04-13 ENCOUNTER — Telehealth: Payer: Self-pay | Admitting: Family Medicine

## 2011-04-17 NOTE — Telephone Encounter (Signed)
Note written

## 2011-04-19 ENCOUNTER — Telehealth: Payer: Self-pay | Admitting: Family Medicine

## 2011-04-19 MED ORDER — METOPROLOL SUCCINATE ER 50 MG PO TB24: 50.0000 mg | ORAL_TABLET | Freq: Every day | ORAL | Status: DC

## 2011-04-19 NOTE — Telephone Encounter (Signed)
Med sent in.

## 2011-05-02 ENCOUNTER — Telehealth: Payer: Self-pay | Admitting: Family Medicine

## 2011-05-02 NOTE — Telephone Encounter (Signed)
Pt to have fasting labs tomorrow. He is suppose to take his Vimpat at 6:30 am with food. Wife concerned if it is ok for him to wait to take this medication later since it is for seizures  Please call

## 2011-05-02 NOTE — Telephone Encounter (Signed)
I informed them that a few hours should not make a difference.

## 2011-05-09 ENCOUNTER — Encounter: Payer: Self-pay | Admitting: Internal Medicine

## 2011-05-18 ENCOUNTER — Ambulatory Visit (INDEPENDENT_AMBULATORY_CARE_PROVIDER_SITE_OTHER): Payer: 59 | Admitting: Family Medicine

## 2011-05-18 ENCOUNTER — Encounter: Payer: Self-pay | Admitting: Family Medicine

## 2011-05-18 VITALS — BP 150/100 | HR 76 | Ht 68.5 in | Wt 190.0 lb

## 2011-05-18 DIAGNOSIS — D320 Benign neoplasm of cerebral meninges: Secondary | ICD-10-CM

## 2011-05-18 DIAGNOSIS — D420 Neoplasm of uncertain behavior of cerebral meninges: Secondary | ICD-10-CM | POA: Insufficient documentation

## 2011-05-18 DIAGNOSIS — Z888 Allergy status to other drugs, medicaments and biological substances status: Secondary | ICD-10-CM

## 2011-05-18 DIAGNOSIS — Z23 Encounter for immunization: Secondary | ICD-10-CM

## 2011-05-18 DIAGNOSIS — Z789 Other specified health status: Secondary | ICD-10-CM

## 2011-05-18 DIAGNOSIS — E785 Hyperlipidemia, unspecified: Secondary | ICD-10-CM

## 2011-05-18 DIAGNOSIS — Z0000 Encounter for general adult medical examination without abnormal findings: Secondary | ICD-10-CM

## 2011-05-18 DIAGNOSIS — I2510 Atherosclerotic heart disease of native coronary artery without angina pectoris: Secondary | ICD-10-CM | POA: Insufficient documentation

## 2011-05-18 DIAGNOSIS — I10 Essential (primary) hypertension: Secondary | ICD-10-CM | POA: Insufficient documentation

## 2011-05-18 DIAGNOSIS — Z2911 Encounter for prophylactic immunotherapy for respiratory syncytial virus (RSV): Secondary | ICD-10-CM

## 2011-05-18 LAB — POCT URINALYSIS DIPSTICK
Bilirubin, UA: NEGATIVE
Blood, UA: NEGATIVE
Glucose, UA: NEGATIVE
Nitrite, UA: NEGATIVE
Spec Grav, UA: 1.01
Urobilinogen, UA: NEGATIVE

## 2011-05-18 MED ORDER — AMLODIPINE BESYLATE 10 MG PO TABS: 10.0000 mg | ORAL_TABLET | Freq: Every day | ORAL | Status: DC

## 2011-05-18 MED ORDER — ROSUVASTATIN CALCIUM 10 MG PO TABS: 10.0000 mg | ORAL_TABLET | Freq: Every day | ORAL | Status: DC

## 2011-05-18 MED ORDER — METOPROLOL SUCCINATE ER 50 MG PO TB24: 50.0000 mg | ORAL_TABLET | Freq: Every day | ORAL | Status: DC

## 2011-05-18 NOTE — Progress Notes (Signed)
Subjective:    Patient ID: Russell Dudley, male    DOB: 07/01/1945, 66 y.o.   MRN: [id number]  HPI He is here for complete examination. He continues to be followed at [facility] neurosurgery. Approximately one week ago he discontinued his prednisone. He is still having some difficulty with weight gain as well as fluid retention. He does check his blood sugars at home and apparently the cuff he uses is accurate. He has a history of ASHD however has not seen cardiology in approximately 10 years. He does have history of statin intolerance. He has tried Lipitor and Zocor without success. His allergies are under good control with the use of Allegra. He does complain of left medial heel pain. He has been using heel cups for the last several weeks with minimal success. His family and social history were reviewed. He does not want a rectal exam and does not want to have PSA testing.  Review of Systems Negative except as above    Objective:   Physical ExamBP 150/100  Pulse 76  Ht 5' 8.5" (1.74 m)  Wt 190 lb (86.183 kg)  BMI 28.47 kg/m2  SpO2 98%  General Appearance:    Alert, cooperative, no distress, appears stated age  Head:    Normocephalic, without obvious abnormality, atraumatic  Eyes:    PERRL, conjunctiva/corneas clear, EOM's intact, fundi    benign  Ears:    Normal TM's and external ear canals  Nose:   Nares normal, mucosa normal, no drainage or sinus   tenderness  Throat:   Lips, mucosa, and tongue normal; teeth and gums normal  Neck:   Supple, no lymphadenopathy;  thyroid:  no   enlargement/tenderness/nodules; no carotid   bruit or JVD  Back:    Spine nontender, no curvature, ROM normal, no CVA     tenderness  Lungs:     Clear to auscultation bilaterally without wheezes, rales or     ronchi; respirations unlabored  Chest Wall:    No tenderness or deformity   Heart:    Regular rate and rhythm, S1 and S2 normal, no murmur, rub   or gallop  Breast Exam:    No chest wall tenderness, masses or  gynecomastia  Abdomen:     Soft, non-tender, nondistended, normoactive bowel sounds,    no masses, no hepatosplenomegaly  Genitalia:   deferred   Rectal:   deferred   Extremities:   No clubbing, cyanosis or edema.left foot exam does show slight tenderness palpation to the medial heel area. Plantar fascia is normal. Good motion of the ankle.   Pulses:   2+ and symmetric all extremities  Skin:   Skin color, texture, turgor normal, no rashes or lesions  Lymph nodes:   Cervical, supraclavicular, and axillary nodes normal  Neurologic:   CNII-XII intact, normal strength, sensation and gait; reflexes 2+ and symmetric throughout          Psych:   Normal mood, affect, hygiene and grooming.   Lab work was reviewed.         Assessment & Plan:   1. Routine general medical examination at a health care facility  POCT Urinalysis Dipstick  2. Hypertension  amLODipine (NORVASC) 10 MG tablet, metoprolol succinate (TOPROL-XL) 50 MG 24 hr tablet  3. Statin intolerance    4. ASHD (arteriosclerotic heart disease)    5. Hyperlipidemia LDL goal < 70    6. Atypical meningioma of brain    7   left heel pain. Recommend continuing  with heel cups also recommend stretching exercises and anti-inflammatory of choice although recommend he check with his pharmacist concerning interaction with his other medications. I will also give Crestor samples he will let me know how he tolerates this. If he is intolerant of this, I will try him on Wellbutrin

## 2011-05-18 NOTE — Patient Instructions (Signed)
Do some heel stretching daily and take Advil. You can take 4 tablets 3 times a day. Check with your pharmacy to make sure there is no drug interaction

## 2011-05-24 ENCOUNTER — Telehealth: Payer: Self-pay | Admitting: Family Medicine

## 2011-05-24 NOTE — Telephone Encounter (Signed)
Wife called and states Morgen is ok on Crestor samples you gave him and wants refill with [facility] 90 days, but will need 30 day local with [facility] [address]

## 2011-05-25 ENCOUNTER — Other Ambulatory Visit: Payer: Self-pay

## 2011-05-25 MED ORDER — ROSUVASTATIN CALCIUM 10 MG PO TABS: 10.0000 mg | ORAL_TABLET | Freq: Every day | ORAL | Status: DC

## 2011-05-25 NOTE — Telephone Encounter (Signed)
[facility] [address]

## 2011-05-30 ENCOUNTER — Telehealth: Payer: Self-pay | Admitting: Internal Medicine

## 2011-05-30 NOTE — Telephone Encounter (Signed)
Yes he can be from Norvasc. If this becomes a real issue, we will need to switch to a different medicine

## 2011-05-31 NOTE — Telephone Encounter (Signed)
Pt is getting on plane tomorrow this has been going on before May 2nd he would like to get RX for something else didn't know if HCTZ would help or something else would please advise

## 2011-06-01 ENCOUNTER — Telehealth: Payer: Self-pay | Admitting: Family Medicine

## 2011-06-01 NOTE — Telephone Encounter (Signed)
A fluid pill will not help. Recommend he come in so we can change medications

## 2011-06-01 NOTE — Telephone Encounter (Signed)
I think I need to talk to them go over all these meds

## 2011-06-01 NOTE — Telephone Encounter (Signed)
Left message word for word  

## 2011-06-01 NOTE — Telephone Encounter (Signed)
Attn: Dr.Lalonde I told her they would need appt when I first talked with them please advise

## 2011-06-01 NOTE — Telephone Encounter (Signed)
Pt wife said they would make appt when they get back

## 2011-06-02 ENCOUNTER — Telehealth: Payer: Self-pay | Admitting: Family Medicine

## 2011-06-02 NOTE — Telephone Encounter (Signed)
PT CALLED AND STATED THAT AFTER THE FLIGHT TO [place] HIS LEGS WERE EXTREMELY SWOLLEN. PT REQUESTED THAT HE STOP TAKING NORVASC. I SPOKE WITH DR Susann Givens AND HE STATED THAT IT WAS OK TO DO SO. JCL ALSO STATED THAT HE SIT WITH FEET ELEVATED WHEN HE IS SITTING DOWN. JCL ALSO STATED THAT WALKING WOULD BE OK TOO. PT WAS INFORMED THAT WHEN HE RETURNS FROM VACATION AFTER MEMORIAL DAY HE MAKE AN APPOINTMENT TO SEE JCL.

## 2011-06-14 ENCOUNTER — Ambulatory Visit (INDEPENDENT_AMBULATORY_CARE_PROVIDER_SITE_OTHER): Payer: 59 | Admitting: Family Medicine

## 2011-06-14 ENCOUNTER — Encounter: Payer: Self-pay | Admitting: Family Medicine

## 2011-06-14 VITALS — BP 160/100 | HR 63 | Wt 194.0 lb

## 2011-06-14 DIAGNOSIS — D320 Benign neoplasm of cerebral meninges: Secondary | ICD-10-CM

## 2011-06-14 DIAGNOSIS — I10 Essential (primary) hypertension: Secondary | ICD-10-CM

## 2011-06-14 DIAGNOSIS — Z789 Other specified health status: Secondary | ICD-10-CM

## 2011-06-14 DIAGNOSIS — E785 Hyperlipidemia, unspecified: Secondary | ICD-10-CM

## 2011-06-14 DIAGNOSIS — Z888 Allergy status to other drugs, medicaments and biological substances status: Secondary | ICD-10-CM

## 2011-06-14 DIAGNOSIS — D420 Neoplasm of uncertain behavior of cerebral meninges: Secondary | ICD-10-CM

## 2011-06-14 MED ORDER — LISINOPRIL-HYDROCHLOROTHIAZIDE 10-12.5 MG PO TABS: 1.0000 | ORAL_TABLET | Freq: Every day | ORAL | Status: DC

## 2011-06-14 NOTE — Progress Notes (Signed)
  Subjective:    Patient ID: Russell Dudley, male    DOB: 05/16/1945, 66 y.o.   MRN: [id number]  HPI He is here for consultation. The Norvasc is causing some peripheral edema and he has stopped taking the medication. He would like to be switched to a different one. He does note that his blood pressure readings at home are much better than here. His machine was checked against ours and is accurate. He does have a history of statin intolerance however presently he is on Crestor. He does complain of some aches and pains and is not sure whether it is related to the Crestor. He also continues to have difficulty with foot pain but does find that the orthotic works quite well. He has been seen recently at [facility] for continued evaluation of his meningioma. He does have some anxiety dealing with his meningioma.  Review of Systems     Objective:   Physical Exam Alert and in no distress otherwise not examined       Assessment & Plan:   1. Hypertension  lisinopril-hydrochlorothiazide (PRINZIDE,ZESTORETIC) 10-12.5 MG per tablet  2. Atypical meningioma of brain    3. Statin intolerance    4. Hyperlipidemia LDL goal < 70     discussed possible side effects of lisinopril in regard to cough and edema. He will call me if he has problems with either. He will continue to be followed at [facility]. He is to hold his Crestor for one week and let him know how he is doing. I discussed anxiety with him in terms of how to handle this better and not let it affect his life.

## 2011-06-14 NOTE — Patient Instructions (Addendum)
Hold the Crestor for a week and let me know. Start the new blood pressure medicine and if you have problems with cough or swelling let you know. Stop your damn worrying!!!

## 2011-06-15 ENCOUNTER — Other Ambulatory Visit: Payer: Self-pay

## 2011-06-15 ENCOUNTER — Telehealth: Payer: Self-pay | Admitting: Family Medicine

## 2011-06-15 NOTE — Telephone Encounter (Signed)
Pt states that he just wanted it on record

## 2011-06-15 NOTE — Telephone Encounter (Signed)
Check and see if he needs a refill and if he does give him a refill

## 2011-06-15 NOTE — Telephone Encounter (Signed)
Dr.lalonde I think this should have come to you

## 2011-07-03 ENCOUNTER — Telehealth: Payer: Self-pay | Admitting: Family Medicine

## 2011-07-03 DIAGNOSIS — I10 Essential (primary) hypertension: Secondary | ICD-10-CM

## 2011-07-03 MED ORDER — LISINOPRIL-HYDROCHLOROTHIAZIDE 10-12.5 MG PO TABS: 1.0000 | ORAL_TABLET | Freq: Every day | ORAL | Status: DC

## 2011-07-03 NOTE — Telephone Encounter (Signed)
Wife called for pt and request rest of year refill for Lisinopril/HCTZ 10/12.5 mg qd sent to mail order [facility] at 90 days each refill.

## 2011-07-03 NOTE — Telephone Encounter (Signed)
Lisinopril  Called in

## 2011-08-01 ENCOUNTER — Encounter: Payer: Self-pay | Admitting: Family Medicine

## 2011-08-01 ENCOUNTER — Ambulatory Visit (INDEPENDENT_AMBULATORY_CARE_PROVIDER_SITE_OTHER): Payer: 59 | Admitting: Family Medicine

## 2011-08-01 VITALS — BP 150/100 | HR 69 | Wt 191.0 lb

## 2011-08-01 DIAGNOSIS — E785 Hyperlipidemia, unspecified: Secondary | ICD-10-CM

## 2011-08-01 DIAGNOSIS — I2510 Atherosclerotic heart disease of native coronary artery without angina pectoris: Secondary | ICD-10-CM

## 2011-08-01 DIAGNOSIS — I10 Essential (primary) hypertension: Secondary | ICD-10-CM

## 2011-08-01 DIAGNOSIS — M722 Plantar fascial fibromatosis: Secondary | ICD-10-CM

## 2011-08-01 MED ORDER — PITAVASTATIN CALCIUM 4 MG PO TABS: 1.0000 | ORAL_TABLET | ORAL | Status: DC

## 2011-08-01 NOTE — Progress Notes (Signed)
  Subjective:    Patient ID: Russell Dudley, male    DOB: 1945-09-09, 66 y.o.   MRN: [id number]  HPI He is here for recheck. He did stop taking his Crestor and he states that the aches and pains did go away but not completely. He is still having some left heel pain that has been going on for several months. It is bothersome especially when he gets up in the morning and if he sits for long period time and gets up again. He did take Lipitor in the past which caused myalgias. He was on WelChol but apparently found it inconvenient to take.  Review of Systems     Objective:   Physical Exam  alert and in no distress. Tender to palpation over the heel spur area. Slight tenderness just distal to this. Good ankle motion. At the Achilles tendon normal.       Assessment & Plan:   1. ASHD (arteriosclerotic heart disease)   2. Hyperlipidemia LDL goal < 70   3. Hypertension   4. Plantar fasciitis of left foot    recommend he continue with the orthotic as well as stretching. Continue on all of his other medications. I will give him a sample of Livalo. He is to call me in one week and let me know how this is working.  Over 20 minutes spent discussing all these issues with him.

## 2011-08-01 NOTE — Patient Instructions (Signed)
Try the new medicine and call me in a week and let me know how you are doing

## 2011-08-07 ENCOUNTER — Telehealth: Payer: Self-pay | Admitting: Family Medicine

## 2011-08-07 DIAGNOSIS — E785 Hyperlipidemia, unspecified: Secondary | ICD-10-CM

## 2011-08-07 MED ORDER — PITAVASTATIN CALCIUM 4 MG PO TABS: 1.0000 | ORAL_TABLET | ORAL | Status: DC

## 2011-08-07 NOTE — Telephone Encounter (Signed)
Livalo renewed.

## 2011-08-08 ENCOUNTER — Telehealth: Payer: Self-pay | Admitting: Family Medicine

## 2011-08-08 DIAGNOSIS — E785 Hyperlipidemia, unspecified: Secondary | ICD-10-CM

## 2011-08-08 MED ORDER — PITAVASTATIN CALCIUM 4 MG PO TABS: 1.0000 | ORAL_TABLET | ORAL | Status: DC

## 2011-08-08 NOTE — Telephone Encounter (Signed)
This was done yesterday.  

## 2011-08-08 NOTE — Telephone Encounter (Signed)
RESENT TODAY

## 2011-08-08 NOTE — Telephone Encounter (Signed)
Please call [facility] with 90 days supply 3 refills for Livalo.

## 2012-01-15 ENCOUNTER — Telehealth: Payer: Self-pay | Admitting: Family Medicine

## 2012-01-15 NOTE — Telephone Encounter (Signed)
Wife called for Russell Dudley wants refill Livalo 4 mg Tab qd  To [facility]  With 90 day refill

## 2012-01-18 ENCOUNTER — Other Ambulatory Visit: Payer: Self-pay

## 2012-01-18 DIAGNOSIS — E785 Hyperlipidemia, unspecified: Secondary | ICD-10-CM

## 2012-01-18 MED ORDER — PITAVASTATIN CALCIUM 4 MG PO TABS: 1.0000 | ORAL_TABLET | ORAL | Status: DC

## 2012-01-18 NOTE — Telephone Encounter (Signed)
Sent in meds per pt wife request

## 2012-01-19 ENCOUNTER — Other Ambulatory Visit: Payer: Self-pay

## 2012-01-19 MED ORDER — PITAVASTATIN CALCIUM 4 MG PO TABS: 4.0000 mg | ORAL_TABLET | Freq: Every day | ORAL | Status: DC

## 2012-01-19 NOTE — Telephone Encounter (Signed)
Resent livalo in

## 2012-02-04 ENCOUNTER — Other Ambulatory Visit: Payer: Self-pay | Admitting: Family Medicine

## 2012-05-30 ENCOUNTER — Telehealth: Payer: Self-pay | Admitting: Family Medicine

## 2012-05-30 NOTE — Telephone Encounter (Signed)
PT'S WIFE CALLED AND SCHEDULED PT'S CPE. SHE IS AN EMPLOYEE AT [employer] SO NEEDS ORDERS FAXED TO HERE BEFORE APPT. PT'S APPT IS June 17 IN THE AFTERNOON. PLEASE FAX ORDERS TO ATTENTION LINDA Yuille AT [phone]

## 2012-05-31 NOTE — Telephone Encounter (Signed)
I fax the lab orders over. CLS

## 2012-05-31 NOTE — Telephone Encounter (Signed)
CBC,Cmet,lipids

## 2012-07-02 ENCOUNTER — Ambulatory Visit (INDEPENDENT_AMBULATORY_CARE_PROVIDER_SITE_OTHER): Payer: 59 | Admitting: Family Medicine

## 2012-07-02 ENCOUNTER — Encounter: Payer: Self-pay | Admitting: Family Medicine

## 2012-07-02 VITALS — BP 150/100 | HR 67 | Ht 68.0 in | Wt 189.0 lb

## 2012-07-02 DIAGNOSIS — D420 Neoplasm of uncertain behavior of cerebral meninges: Secondary | ICD-10-CM

## 2012-07-02 DIAGNOSIS — Z0000 Encounter for general adult medical examination without abnormal findings: Secondary | ICD-10-CM

## 2012-07-02 DIAGNOSIS — I10 Essential (primary) hypertension: Secondary | ICD-10-CM

## 2012-07-02 DIAGNOSIS — E785 Hyperlipidemia, unspecified: Secondary | ICD-10-CM

## 2012-07-02 DIAGNOSIS — D320 Benign neoplasm of cerebral meninges: Secondary | ICD-10-CM

## 2012-07-02 DIAGNOSIS — Z888 Allergy status to other drugs, medicaments and biological substances status: Secondary | ICD-10-CM

## 2012-07-02 DIAGNOSIS — I2510 Atherosclerotic heart disease of native coronary artery without angina pectoris: Secondary | ICD-10-CM

## 2012-07-02 DIAGNOSIS — Z789 Other specified health status: Secondary | ICD-10-CM

## 2012-07-02 MED ORDER — COLESEVELAM HCL 3.75 G PO PACK: 1.0000 | PACK | ORAL | Status: DC

## 2012-07-02 MED ORDER — LISINOPRIL-HYDROCHLOROTHIAZIDE 10-12.5 MG PO TABS: ORAL_TABLET | ORAL | Status: DC

## 2012-07-02 NOTE — Progress Notes (Signed)
Subjective:    Patient ID: Russell Dudley, male    DOB: Aug 27, 1945, 67 y.o.   MRN: [id number]  HPI He is here for complete examination. He does have underlying statin intolerance as well as hypertension and heart disease. His main complaint is the fact that he cannot be as active as he would like to be. Apparently when he becomes active he also has difficulty with potential seizures. He also notes that since he has been on the beta blocker, he has had more difficulty with erectile dysfunction. Review of record indicates he has a history of white coat hypertension. His numbers at home of all been normal and he did check his blood pressure machine against ours and it is accurate. He continues to be followed at [facility] for his meningioma. He did stop taking his statin due to continued difficulty with aches and pains.   Review of Systems negative except as above    Objective:   Physical Exam BP 150/100  Pulse 67  Ht 5\' 8"  (1.727 m)  Wt 189 lb (85.73 kg)  BMI 28.74 kg/m2  General Appearance:    Alert, cooperative, no distress, appears stated age  Head:    Normocephalic, without obvious abnormality, atraumatic  Eyes:    PERRL, conjunctiva/corneas clear, EOM's intact, fundi    benign  Ears:    Normal TM's and external ear canals  Nose:   Nares normal, mucosa normal, no drainage or sinus   tenderness  Throat:   Lips, mucosa, and tongue normal; teeth and gums normal  Neck:   Supple, no lymphadenopathy;  thyroid:  no   enlargement/tenderness/nodules; no carotid   bruit or JVD  Back:    Spine nontender, no curvature, ROM normal, no CVA     tenderness  Lungs:     Clear to auscultation bilaterally without wheezes, rales or     ronchi; respirations unlabored  Chest Wall:    No tenderness or deformity   Heart:    Regular rate and rhythm, S1 and S2 normal, no murmur, rub   or gallop  Breast Exam:    No chest wall tenderness, masses or gynecomastia  Abdomen:     Soft, non-tender,  nondistended, normoactive bowel sounds,    no masses, no hepatosplenomegaly  Genitalia:  Deferred at patient request  Rectal:  Deferred at patient request  Extremities:   No clubbing, cyanosis or edema  Pulses:   2+ and symmetric all extremities  Skin:   Skin color, texture, turgor normal, no rashes or lesions  Lymph nodes:   Cervical, supraclavicular, and axillary nodes normal  Neurologic:   CNII-XII intact, normal strength, sensation and gait; reflexes 2+ and symmetric throughout          Psych:   Normal mood, affect, hygiene and grooming.          Assessment & Plan:  Routine general medical examination at a health care facility  Hypertension - Plan: lisinopril-hydrochlorothiazide (PRINZIDE,ZESTORETIC) 10-12.5 MG per tablet  Statin intolerance  ASHD (arteriosclerotic heart disease)  Hyperlipidemia LDL goal < 70 - Plan: Colesevelam HCl 3.75 G PACK  Atypical meningioma of brain I will have him drop down on his Toprol to 25 mg to see if that will help his erectile dysfunction. I will also place him on WelChol. Renew his Prinzide. Discussed his physical activities and explained that he needs to listen to his body and not his mind. He will need to take things will but slower than he would like  but I told her not to push the issue.

## 2012-07-02 NOTE — Patient Instructions (Signed)
Listen to your body not your head.

## 2012-07-29 ENCOUNTER — Telehealth: Payer: Self-pay | Admitting: Internal Medicine

## 2012-07-29 MED ORDER — METOPROLOL SUCCINATE ER 25 MG PO TB24: 25.0000 mg | ORAL_TABLET | Freq: Every day | ORAL | Status: DC

## 2012-07-29 NOTE — Telephone Encounter (Signed)
SENT IN METOPROLOL 25MG   JCL DROPED HIM DOWN FROM 50 MG LAST OV

## 2012-07-29 NOTE — Telephone Encounter (Signed)
Pt needs a refill on metoprolol 50mg  to [facility]

## 2012-09-20 ENCOUNTER — Encounter: Payer: Self-pay | Admitting: Family Medicine

## 2012-11-12 ENCOUNTER — Telehealth: Payer: Self-pay | Admitting: Internal Medicine

## 2012-11-12 NOTE — Telephone Encounter (Signed)
The nurse practitioner should be able to order that test. Since I don't have any information on why she wants it,it would be difficult for me to write for it.

## 2012-11-12 NOTE — Telephone Encounter (Signed)
LINDA IS AWARE OF WHAT JCL WANTED HER TO KNOW TO HAVE [facility] TO WRITE FOR IT SHE VERBALIZED UNDERSTANDING

## 2012-11-12 NOTE — Telephone Encounter (Signed)
Pt wife called to state that Lubertha Sayres a nurse practicer @ [facility] in radiology requested for pt to get an adrenal insuffiency test and a thryoid function test. She said that she works at [employer] and he can get it done free. They just have to have an order for it to be done. She will call tomorrow with a fax number so we can send the order over.

## 2012-11-25 ENCOUNTER — Encounter: Payer: Self-pay | Admitting: Family Medicine

## 2012-11-25 ENCOUNTER — Ambulatory Visit (INDEPENDENT_AMBULATORY_CARE_PROVIDER_SITE_OTHER): Payer: 59 | Admitting: Family Medicine

## 2012-11-25 VITALS — BP 180/108 | HR 72 | Ht 68.0 in | Wt 198.0 lb

## 2012-11-25 DIAGNOSIS — D420 Neoplasm of uncertain behavior of cerebral meninges: Secondary | ICD-10-CM

## 2012-11-25 DIAGNOSIS — R5381 Other malaise: Secondary | ICD-10-CM

## 2012-11-25 DIAGNOSIS — E2740 Unspecified adrenocortical insufficiency: Secondary | ICD-10-CM

## 2012-11-25 DIAGNOSIS — I10 Essential (primary) hypertension: Secondary | ICD-10-CM

## 2012-11-25 DIAGNOSIS — Z79899 Other long term (current) drug therapy: Secondary | ICD-10-CM

## 2012-11-25 DIAGNOSIS — R7989 Other specified abnormal findings of blood chemistry: Secondary | ICD-10-CM

## 2012-11-25 DIAGNOSIS — D320 Benign neoplasm of cerebral meninges: Secondary | ICD-10-CM

## 2012-11-25 DIAGNOSIS — R5383 Other fatigue: Secondary | ICD-10-CM

## 2012-11-25 DIAGNOSIS — E2749 Other adrenocortical insufficiency: Secondary | ICD-10-CM

## 2012-11-25 NOTE — Progress Notes (Signed)
  Subjective:    Patient ID: Russell Dudley, male    DOB: [birth year], 67 y.o.   MRN: [id number]  HPI He is here for consult. He has an underlying history of an atypical meningioma and has been on 1 mg of dexamethasone and February 2012 . She has been tapering and was on one half milligram for the last 6 weeks. He is noted in spite of being on the dexamethasone his energy levels have been quite low. He had an MRI recently of his brain which showed very little change in his tumor size. He did have recent blood work which showed TSH to be 3.04. Cortisol in the morning was 2 with low normal being 6.2. He had blood work done in August including CBC and Cmet which was negative. He's had no fever, chills, cough, congestion. He has noted decreased energy and libido.   Review of Systems     Objective:   Physical Exam alert and in no distress. Tympanic membranes and canals are normal. Throat is clear. Tonsils are normal. Neck is supple without adenopathy or thyromegaly. Cardiac exam shows a regular sinus rhythm without murmurs or gallops. Lungs are clear to auscultation. Exam of the left forehead does show an area of erythema however it is nontender. Reflexes are brisk. Skin is normal.        Assessment & Plan:  Atypical meningioma of brain - Plan: Ambulatory referral to Endocrinology  Hypertension  Low serum cortisol level - Plan: Ambulatory referral to Endocrinology  Fatigue - Plan: Testosterone, Ambulatory referral to Endocrinology  Encounter for long-term (current) use of other medications - Plan: CBC with Differential, Comprehensive metabolic panel, Testosterone  He has been on dexamethasone for quite some time but apparently his symptoms started prior to him being on the dexamethasone. Explained that this could certainly meds of his adrenal cortical axis but further evaluation is certainly warranted.

## 2012-11-26 LAB — CBC WITH DIFFERENTIAL/PLATELET
Hemoglobin: 16.8 g/dL (ref 13.0–17.0)
Lymphocytes Relative: 33 % (ref 12–46)
Lymphs Abs: 2.9 10*3/uL (ref 0.7–4.0)
MCH: 30.2 pg (ref 26.0–34.0)
Monocytes Relative: 8 % (ref 3–12)
Neutrophils Relative %: 55 % (ref 43–77)
Platelets: 350 10*3/uL (ref 150–400)
RBC: 5.56 MIL/uL (ref 4.22–5.81)
WBC: 8.8 10*3/uL (ref 4.0–10.5)

## 2012-11-26 LAB — COMPREHENSIVE METABOLIC PANEL
ALT: 29 U/L (ref 0–53)
Albumin: 4.2 g/dL (ref 3.5–5.2)
CO2: 25 mEq/L (ref 19–32)
Calcium: 9.4 mg/dL (ref 8.4–10.5)
Chloride: 95 mEq/L — ABNORMAL LOW (ref 96–112)
Glucose, Bld: 92 mg/dL (ref 70–99)
Potassium: 4.4 mEq/L (ref 3.5–5.3)
Sodium: 131 mEq/L — ABNORMAL LOW (ref 135–145)
Total Protein: 6.8 g/dL (ref 6.0–8.3)

## 2012-11-26 LAB — TESTOSTERONE: Testosterone: 411 ng/dL (ref 300–890)

## 2012-11-26 NOTE — Progress Notes (Signed)
Quick Note:  MAILED COPY OF LABS AND LETTER TO PT ______

## 2012-11-29 ENCOUNTER — Telehealth: Payer: Self-pay

## 2012-11-29 NOTE — Telephone Encounter (Signed)
Joann called me back needed address phone # ss#

## 2012-11-29 NOTE — Telephone Encounter (Signed)
CALLED JOANN BACK PER NOTE LEFT FOR ME DR.SOUTH WILL TAKE PT ON AND JOANN NEEDED SOME MORE INFO FROM ME LEFT MESSAGE TO CALL ME BACK

## 2012-12-03 ENCOUNTER — Telehealth: Payer: Self-pay | Admitting: Family Medicine

## 2012-12-03 NOTE — Telephone Encounter (Signed)
LEFT MESSAGE FOR JOANN TO CALL ME BACK SHE IS A SCHEDULER THERE

## 2012-12-03 NOTE — Telephone Encounter (Signed)
JOANN CALLED AND SAID SHE HAD TALKED WITH LINDA AND HE HAS APPOINTMENT MONDAY

## 2013-04-08 ENCOUNTER — Other Ambulatory Visit: Payer: Self-pay | Admitting: Family Medicine

## 2013-07-01 ENCOUNTER — Other Ambulatory Visit: Payer: Self-pay | Admitting: Family Medicine

## 2013-07-01 NOTE — Telephone Encounter (Signed)
Is this ok to refill? Last ov 07/02/12

## 2013-09-02 ENCOUNTER — Other Ambulatory Visit: Payer: Self-pay | Admitting: Family Medicine

## 2014-03-02 ENCOUNTER — Encounter: Payer: Self-pay | Admitting: Family Medicine

## 2014-03-02 ENCOUNTER — Ambulatory Visit (INDEPENDENT_AMBULATORY_CARE_PROVIDER_SITE_OTHER): Payer: 59 | Admitting: Family Medicine

## 2014-03-02 VITALS — BP 128/82 | HR 76

## 2014-03-02 DIAGNOSIS — G40209 Localization-related (focal) (partial) symptomatic epilepsy and epileptic syndromes with complex partial seizures, not intractable, without status epilepticus: Secondary | ICD-10-CM

## 2014-03-02 DIAGNOSIS — R269 Unspecified abnormalities of gait and mobility: Secondary | ICD-10-CM | POA: Insufficient documentation

## 2014-03-02 DIAGNOSIS — E2749 Other adrenocortical insufficiency: Secondary | ICD-10-CM | POA: Insufficient documentation

## 2014-03-02 DIAGNOSIS — D320 Benign neoplasm of cerebral meninges: Secondary | ICD-10-CM

## 2014-03-02 DIAGNOSIS — D420 Neoplasm of uncertain behavior of cerebral meninges: Secondary | ICD-10-CM

## 2014-03-02 NOTE — Progress Notes (Signed)
   Subjective:    Patient ID: Russell Dudley, male    DOB: 1945-03-15, 69 y.o.   MRN: [id number]  HPI He complains mainly of difficulty with right foot swelling off and on for several years. He has been dealing with worsening of his symptoms from his meningioma. He has had radiation and now is having difficulty with ambulation. He does use a cane. He continues on medications listed in the chart. He continues to be followed at [facility] for this. He is quite distraught over this as he went from a very active and viable individual is now is having difficulty dealing with his lack of independence.   Review of Systems     Objective:   Physical Exam Alert and in no distress. Exam of both lower extremities does show 1+ dorsalis pedis pulses. Good capillary refill. Normal rope reception. Reflexes were not checked. Negative Homans sign.       Assessment & Plan:  Atypical meningioma of brain - Plan: Ambulatory referral to Physical Therapy  Iatrogenic adrenal insufficiency  Abnormality of gait  Partial epilepsy with impairment of consciousness, not intractable  I explained that there is a slight difference in the color between the 2 but not enough to of concern and no evidence of venous or arterial problem. I will have him evaluated by physical therapy to make sure he has a right mobility devices. Will also have home health come by to assessing needs a minute around the house. One half hour spent discussing all these issues with him and his wife.

## 2014-03-05 ENCOUNTER — Ambulatory Visit: Payer: 59 | Attending: Family Medicine | Admitting: Physical Therapy

## 2014-03-05 ENCOUNTER — Encounter: Payer: Self-pay | Admitting: Physical Therapy

## 2014-03-05 ENCOUNTER — Telehealth: Payer: Self-pay | Admitting: Internal Medicine

## 2014-03-05 DIAGNOSIS — R269 Unspecified abnormalities of gait and mobility: Secondary | ICD-10-CM | POA: Diagnosis not present

## 2014-03-05 DIAGNOSIS — D329 Benign neoplasm of meninges, unspecified: Secondary | ICD-10-CM | POA: Diagnosis not present

## 2014-03-05 DIAGNOSIS — R2689 Other abnormalities of gait and mobility: Secondary | ICD-10-CM

## 2014-03-05 NOTE — Telephone Encounter (Signed)
Faxed over all info to [facility] for home health evaulation

## 2014-03-05 NOTE — Telephone Encounter (Signed)
[facility]ne Cornea from [facility] called stating that they have to declined care due to not enough staffing in the [place] area. I have called Nathane[facility] Canary and asking them to send over a referral form for me to fill out and send in.

## 2014-03-06 NOTE — Therapy (Signed)
[facility] [address] [place], [place], [address] Phone: [phone]   Fax:  [phone]  Physical Therapy Evaluation  Patient Details  Name: Russell Dudley MRN: [id number] Date of Birth: 1946/01/15 Referring Provider:  Denita Lung, MD  Encounter Date: 03/05/2014      PT End of Session - 03/05/14 0930    Visit Number 1   Number of Visits 1   PT Start Time 0930   PT Stop Time 1015   PT Time Calculation (min) 45 min   Activity Tolerance Patient tolerated treatment well   Behavior During Therapy [facility] for tasks assessed/performed      Past Medical History  Diagnosis Date  . Hypertension   . ASHD (arteriosclerotic heart disease)   . Dyslipidemia   . History of meningioma   . Allergic rhinitis     Past Surgical History  Procedure Laterality Date  . Meningioma    . Colonoscopy  2008    Dr. Carlean Purl    There were no vitals taken for this visit.  Visit Diagnosis:  Abnormality of gait  Balance problems      Subjective Assessment - 03/05/14 0942    Symptoms This 69yo male had diagnosis of Meningoma December 2005. He had gamma knife brain surgery March 2006 & August 2011 with radiation following both and also in 2013. He presents for PT evaluation. He is worse since summer with balance, gait & movements.    Patient Stated Goals He wants to move better. He came because his PCP told him to come.   Currently in Pain? No/denies          [facility] PT Assessment - 03/05/14 0930    Assessment   Medical Diagnosis Meningoma   Precautions   Precautions Fall  history of Sz   Restrictions   Weight Bearing Restrictions No   Balance Screen   Has the patient fallen in the past 6 months Yes   How many times? 1  toileting in night   Has the patient had a decrease in activity level because of a fear of falling?  No   Is the patient reluctant to leave their home because of a fear of falling?  Yes   [place] Private residence   Living Arrangements Spouse/significant other  8# dogs (2)   Type of [place] to enter   Entrance Stairs-Number of Steps 1   Entrance Stairs-Rails None  uses door frame   Home Layout Two level;1/2 bath on main level;Bed/bath upstairs   Alternate Level Stairs-Number of Steps 14   Alternate Level Stairs-Rails Right;Left  right full way, left upper half   Prior Function   Level of Independence Independent with homemaking with ambulation;Independent with basic ADLs;Independent with gait;Independent with transfers   Vocation Retired   Leisure walk outside   [employer]   Overall Cognitive Status Within Functional Limits for tasks assessed   Observation/Other Assessments   Focus on Therapeutic Outcomes (FOTO)  57  functional status   Activities of Balance Confidence Scale (ABC Scale)  15.6%   Fear Avoidance Belief Questionnaire (FABQ)  41 (10)   Coordination   Gross Motor Movements are Fluid and Coordinated Yes   Finger Nose Finger Test accurate with eyes open and 1cm off with eyes closed   Posture/Postural Control   Posture/Postural Control No significant limitations   PROM   Overall PROM  Within functional limits for tasks  performed   Strength   Overall Strength Within functional limits for tasks performed   Transfers   Sit to Stand 6: Modified independent (Device/Increase time);With upper extremity assist;From chair/3-in-1;With armrests   Stand to Sit 5: Supervision;With upper extremity assist;To chair/3-in-1   Ambulation/Gait   Ambulation/Gait Yes   Ambulation/Gait Assistance 5: Supervision;4: Min assist   Ambulation Distance (Feet) 200 Feet   Assistive device Straight cane;Rollator  cane with min A and rollator with SBA   Ambulation Surface Level;Indoor   Gait velocity 3.12 ft/sec   Ramp 5: Supervision  cues on use of rollator   Curb 5: Supervision  cues on use of rollator   Berg Balance Test   Sit to Stand  Able to stand  independently using hands   Standing Unsupported Needs several tries to stand 30 seconds unsupported  safe with UE support   Sitting with Back Unsupported but Feet Supported on Floor or Stool Able to sit safely and securely 2 minutes   Stand to Sit Controls descent by using hands   Transfers Able to transfer safely, definite need of hands   Standing Unsupported with Eyes Closed Unable to keep eyes closed 3 seconds but stays steady  safe with Bil. UE support   Standing Ubsupported with Feet Together Needs help to attain position and unable to hold for 15 seconds  supervision with Bil. UE support   From Standing, Reach Forward with Outstretched Arm Reaches forward but needs supervision  reaches 10" with UE support   From Standing Position, Pick up Object from Floor Unable to try/needs assist to keep balance  supervision with UE support   From Standing Position, Turn to Look Behind Over each Shoulder Needs supervision when turning  safe with Bil. UE support   Turn 360 Degrees Needs assistance while turning   Standing Unsupported, Alternately Place Feet on Step/Stool Needs assistance to keep from falling or unable to try   Standing Unsupported, One Foot in [employer] balance while stepping or standing   Standing on One Leg Unable to try or needs assist to prevent fall   Total Score 17     Gait Training: PT instructed patient and wife in use of rollator walker including ramp & curb. Patient return demonstration.  Therapeutic Exercise: See patient education                     PT Education - 03/05/14 0930    Education provided Yes   Education Details use of rollator Tresa Garter, RW vs rollator stability, gait with cane with family assist only, exercise types with recommendation to include flexibilitiy /stretching, balance, strength and endurance (time over intensity) as tolerable.   Person(s) Educated Patient;Spouse   Methods Explanation;Demonstration    Comprehension Verbalized understanding;Returned demonstration             PT Long Term Goals - 03/05/14 0930    PT LONG TERM GOAL #1   Title verbalizes understanding of assistive device use to reduce fall risk.    Baseline MET at evaluation   Status Achieved   PT LONG TERM GOAL #2   Title verbalizes understanding of fitness program / HEP with medical issues.    Baseline MET at evaluation   Status Achieved               Plan - 03/05/14 0930    Clinical Impression Statement This 69yo male has been dealing with issues associated with Meningoma for 10 years. He came for PT  evaluation with PCP recommendation. He did not feel a need for PT as he is realistic about disease process. Balance and gait are consistent with post-radiation. PT reviewed exercise program with patient but he verbalized understanding with strong base knowledge thru the years. Patient verbalized safe use of assistive devices to reduce fall risk.    Pt will benefit from skilled therapeutic intervention in order to improve on the following deficits Abnormal gait   Rehab Potential Good   PT Frequency One time visit   PT Treatment/Interventions DME Instruction;Gait training;Therapeutic exercise;Patient/family education   PT Next Visit Plan One time visit   Consulted and Agree with Plan of Care Patient;Family member/caregiver   Family Member Consulted wife         Problem List Patient Active Problem List   Diagnosis Date Noted  . Iatrogenic adrenal insufficiency 03/02/2014  . Abnormality of gait 03/02/2014  . Partial epilepsy with impairment of consciousness, not intractable 03/02/2014  . Hypertension 05/18/2011  . Statin intolerance 05/18/2011  . ASHD (arteriosclerotic heart disease) 05/18/2011  . Hyperlipidemia LDL goal < 70 05/18/2011  . Atypical meningioma of brain 05/18/2011    Pebbles Zeiders PT, DPT 03/06/2014, 10:54 AM  [facility] [address] [place], [place], [address] Phone: [phone]   Fax:  [phone]

## 2014-04-01 ENCOUNTER — Other Ambulatory Visit: Payer: Self-pay | Admitting: Family Medicine

## 2014-04-01 NOTE — Telephone Encounter (Signed)
Have her bring in the blood pressure cuff and compare with ours. It could be that he has whitecoat hypertension

## 2014-04-01 NOTE — Telephone Encounter (Signed)
Is this okay?

## 2014-04-01 NOTE — Telephone Encounter (Signed)
Pt wife called and stating that pt bp is always up when he goes to the Doctor and she is wondering if the brand name would work better than the generic. If so send in Toprol extended release

## 2014-04-01 NOTE — Telephone Encounter (Signed)
Left message word for word per jcl

## 2014-07-13 ENCOUNTER — Other Ambulatory Visit: Payer: Self-pay

## 2014-08-05 ENCOUNTER — Other Ambulatory Visit: Payer: Self-pay | Admitting: Family Medicine

## 2014-09-03 ENCOUNTER — Other Ambulatory Visit: Payer: Self-pay | Admitting: Family Medicine

## 2014-09-03 NOTE — Telephone Encounter (Signed)
Is this okay to refill? 

## 2014-09-09 ENCOUNTER — Telehealth: Payer: Self-pay | Admitting: Family Medicine

## 2014-09-09 NOTE — Telephone Encounter (Signed)
Wife called and states her job [employer] requires them to get a wellness screening and she has called them about her husband's conditions and chemo treatments and they stated that they needed a letter from his PCP stating that it is inadvisable for pt to get the wellness screening due to medical conditions, instability and balance issues.   Can you write letter for this  Please call wife Vaughan Basta t# [phone] when ready

## 2014-09-10 ENCOUNTER — Encounter: Payer: Self-pay | Admitting: Family Medicine

## 2014-09-10 NOTE — Telephone Encounter (Signed)
Letter typed and faxed to [phone]

## 2014-09-10 NOTE — Telephone Encounter (Signed)
He really doesn't need a wellness screen since he is getting adequate follow-up already

## 2014-09-10 NOTE — Telephone Encounter (Signed)
So can she have a letter stating this

## 2014-10-08 ENCOUNTER — Encounter: Payer: Self-pay | Admitting: Family Medicine

## 2014-11-04 ENCOUNTER — Telehealth: Payer: Self-pay

## 2014-11-04 NOTE — Telephone Encounter (Addendum)
Russell Dudley was seen at [facility] and was given Sodium 5mg  t.i.d. The wife believes that the Lisinopril-hydrochlorothiazide you prescribed him here takes sodium out of the body and would like to know if you could switch him to a different BP med. She says she is not 100% sure if she is correct in saying that the lisinopril is taking the sodium. She would like to know if this is true and if it is what you would do about it.

## 2014-11-05 NOTE — Telephone Encounter (Signed)
Have them set up an appointment

## 2014-11-05 NOTE — Telephone Encounter (Signed)
This has been done.

## 2014-11-11 ENCOUNTER — Encounter: Payer: Self-pay | Admitting: Family Medicine

## 2014-11-11 ENCOUNTER — Ambulatory Visit (INDEPENDENT_AMBULATORY_CARE_PROVIDER_SITE_OTHER): Payer: 59 | Admitting: Family Medicine

## 2014-11-11 VITALS — BP 160/100 | HR 59

## 2014-11-11 DIAGNOSIS — R269 Unspecified abnormalities of gait and mobility: Secondary | ICD-10-CM

## 2014-11-11 DIAGNOSIS — G40209 Localization-related (focal) (partial) symptomatic epilepsy and epileptic syndromes with complex partial seizures, not intractable, without status epilepticus: Secondary | ICD-10-CM

## 2014-11-11 DIAGNOSIS — I10 Essential (primary) hypertension: Secondary | ICD-10-CM | POA: Diagnosis not present

## 2014-11-11 DIAGNOSIS — E871 Hypo-osmolality and hyponatremia: Secondary | ICD-10-CM

## 2014-11-11 DIAGNOSIS — D420 Neoplasm of uncertain behavior of cerebral meninges: Secondary | ICD-10-CM | POA: Diagnosis not present

## 2014-11-11 LAB — BASIC METABOLIC PANEL
BUN: 9 mg/dL (ref 7–25)
CHLORIDE: 97 mmol/L — AB (ref 98–110)
CO2: 27 mmol/L (ref 20–31)
Calcium: 9.2 mg/dL (ref 8.6–10.3)
Creat: 0.8 mg/dL (ref 0.70–1.25)
Glucose, Bld: 100 mg/dL — ABNORMAL HIGH (ref 65–99)
POTASSIUM: 4.6 mmol/L (ref 3.5–5.3)
Sodium: 135 mmol/L (ref 135–146)

## 2014-11-11 MED ORDER — LISINOPRIL-HYDROCHLOROTHIAZIDE 20-12.5 MG PO TABS
1.0000 | ORAL_TABLET | Freq: Every day | ORAL | Status: DC
Start: 2014-11-11 — End: 2015-03-24

## 2014-11-11 NOTE — Progress Notes (Signed)
   Subjective:    Patient ID: Russell Dudley, male    DOB: 30-Jul-1945, 69 y.o.   MRN: [id number]  HPI He is here for an interval evaluation. He continues to be followed at [facility]. Presently he is on a Avastin and apparently this is causing some difficulty with his sodium. He has had that checked regularly. He is also now back on hydrocortisone 10 mg daily. He apparently did have a breakthrough on CNS activity in this tends to quiet things down in terms of his epilepsy. He also continues have difficulty with his gait. He was evaluated by physical therapy and is using a walker. He states that he has strength but no concept of where his foot is in place and time. He does have underlying hypertension however blood pressures at home are normal. He did check his cuff against ours in the office and apparently it is accurate.   Review of Systems     Objective:   Physical Exam Alert and in no distress. Exam of his lower extremities does show 3+ reflexes in both knees and diminished reflexes in both ankles. Strength appears to be normal. Lab data from [facility] does show a low sodium levels.      Assessment & Plan:  Hyponatremia - Plan: Basic Metabolic Panel, CANCELED: Basic Metabolic Panel  Essential hypertension - Whitecoat hypertension - Plan: lisinopril-hydrochlorothiazide (ZESTORETIC) 20-12.5 MG tablet, Basic Metabolic Panel  Atypical meningioma of brain (HCC)  Abnormality of gait  Partial epilepsy with impairment of consciousness, not intractable (HCC)  he will continue on his present medications. Strongly encouraged him to use the walker. Also encouraged possibly looking at getting living arrangements were his bedroom is not upstairs. He will continue on his steroids. His blood pressure does seem to be whitecoat hypertension related. He will continue to monitor this at home. I will increase his lisinopril to 20/12.5 and monitor his sodium level. Prescription also written for the wife to  get occasional like lites done on him to help monitor his sodium. Over 25 minutes, greater than 50% spent in counseling and coordination of care.

## 2014-11-13 ENCOUNTER — Encounter: Payer: Self-pay | Admitting: Family Medicine

## 2015-01-27 ENCOUNTER — Telehealth: Payer: Self-pay | Admitting: Medical

## 2015-01-27 ENCOUNTER — Encounter: Payer: Self-pay | Admitting: Family Medicine

## 2015-01-27 NOTE — Telephone Encounter (Signed)
See message below that came to me:   I emailed Dave's neuro-oncologist Dr. Lorri Frederick' at [facility] yesterday explaining he is having great difficulty walking now even with his walker. Both knees are now bent, not just the right knee and he is having difficulty getting his feet to move. I asked why he could be getting worse when his last MRI (Dec. 12) showed little change. I asked if something else could be wrong.         Dr. Ferdinand Lango' nurse Josie Dixon replied Waunita Schooner should be evaluated by his local physician and if he would like to discuss Dave's symptoms with Dr. Ferdinand Lango, please have him call [phone] and ask the [facility] paging Operator to page Dr. Ferdinand Lango directly.        Do you believe you need to see Waunita Schooner or should [facility] be handling his care for this problem?        Thank you,    Toy Cookey

## 2015-01-28 ENCOUNTER — Ambulatory Visit (INDEPENDENT_AMBULATORY_CARE_PROVIDER_SITE_OTHER): Payer: 59 | Admitting: Family Medicine

## 2015-01-28 ENCOUNTER — Encounter: Payer: Self-pay | Admitting: Family Medicine

## 2015-01-28 VITALS — BP 170/100 | HR 62 | Ht 69.0 in | Wt 197.0 lb

## 2015-01-28 DIAGNOSIS — D420 Neoplasm of uncertain behavior of cerebral meninges: Secondary | ICD-10-CM | POA: Diagnosis not present

## 2015-01-28 DIAGNOSIS — R269 Unspecified abnormalities of gait and mobility: Secondary | ICD-10-CM

## 2015-01-28 MED ORDER — DEXAMETHASONE 4 MG PO TABS: 4.0000 mg | ORAL_TABLET | Freq: Two times a day (BID) | ORAL | Status: DC

## 2015-01-28 NOTE — Progress Notes (Signed)
   Subjective:    Patient ID: Russell Dudley, male    DOB: 1945/06/03, 70 y.o.   MRN: [id number]  HPI He is here for consult concerning continued difficulty with ambulation. He does have an underlying gait disturbances as well as atypical meningioma. He is followed at [facility] and was seen there in mid December. An MRI was done which showed little interval change. He states that he has had difficulty over the last year with walking especially with the right. He was diagnosed with a proprioceptive abnormality. He states that he has difficulty walking could he is not sure where his right leg is. He states that in the last month this has gotten much worse. He is on medications listed in the chart including steroids.   Review of Systems     Objective:   Physical Exam Alert and in no distress. It was difficult for him to get to a standing position. Strength in his right leg is normal. Reflexes are 4 process in the knee and absent in the ankle.       Assessment & Plan:  Abnormality of gait - Plan: dexamethasone (DECADRON) 4 MG tablet  Atypical meningioma of brain (HCC) - Plan: dexamethasone (DECADRON) 4 MG tablet Case was discussed with his neurologist at [facility], Dr. Suzan Slick. I will give him Decadron 4 mg per day he is to call and set up an appointment for follow-up visit next week.

## 2015-02-22 ENCOUNTER — Telehealth: Payer: Self-pay | Admitting: Family Medicine

## 2015-02-22 DIAGNOSIS — D45 Polycythemia vera: Secondary | ICD-10-CM

## 2015-02-22 NOTE — Telephone Encounter (Signed)
Pt wife called and said that the PA from [place] on Friday that the pt needs polycythemiavera labs done if you could send the order to pt wife so she could take it to [facility], and if there is any other labs you would like drawn, he has labs done before kemo every week, and noticed that his hematocrit where high., PT see DR Ferdinand Lango at [facility]. .she said if you would like to call her and talk that would be great pt wife at cell [phone] (M) and her work also at [phone] with any questions, she states that you can look at his lab work on the [employer], said please advise.

## 2015-02-22 NOTE — Telephone Encounter (Signed)
Set him up to see hematology for evaluation of possible polycythemia

## 2015-02-26 ENCOUNTER — Telehealth: Payer: Self-pay | Admitting: Hematology

## 2015-02-26 NOTE — Telephone Encounter (Signed)
spouse Vaughan Basta cld to verify appt for 2/13

## 2015-03-01 ENCOUNTER — Ambulatory Visit (HOSPITAL_BASED_OUTPATIENT_CLINIC_OR_DEPARTMENT_OTHER): Payer: 59 | Admitting: Hematology

## 2015-03-01 ENCOUNTER — Ambulatory Visit (HOSPITAL_BASED_OUTPATIENT_CLINIC_OR_DEPARTMENT_OTHER): Payer: 59

## 2015-03-01 ENCOUNTER — Other Ambulatory Visit: Payer: Self-pay | Admitting: Hematology

## 2015-03-01 ENCOUNTER — Inpatient Hospital Stay (HOSPITAL_COMMUNITY): Admission: RE | Admit: 2015-03-01 | Payer: 59 | Source: Ambulatory Visit

## 2015-03-01 ENCOUNTER — Telehealth: Payer: Self-pay | Admitting: Hematology

## 2015-03-01 ENCOUNTER — Encounter: Payer: Self-pay | Admitting: Hematology

## 2015-03-01 VITALS — BP 132/82 | HR 69 | Temp 98.1°F | Resp 18

## 2015-03-01 DIAGNOSIS — D45 Polycythemia vera: Secondary | ICD-10-CM

## 2015-03-01 DIAGNOSIS — M79604 Pain in right leg: Secondary | ICD-10-CM

## 2015-03-01 DIAGNOSIS — R209 Unspecified disturbances of skin sensation: Secondary | ICD-10-CM

## 2015-03-01 LAB — CBC & DIFF AND RETIC
BASO%: 0.1 % (ref 0.0–2.0)
BASOS ABS: 0 10*3/uL (ref 0.0–0.1)
EOS ABS: 0 10*3/uL (ref 0.0–0.5)
EOS%: 0.1 % (ref 0.0–7.0)
HEMATOCRIT: 51.8 % — AB (ref 38.4–49.9)
HEMOGLOBIN: 17.9 g/dL — AB (ref 13.0–17.1)
Immature Retic Fract: 5.8 % (ref 3.00–10.60)
LYMPH%: 17.1 % (ref 14.0–49.0)
MCH: 30.3 pg (ref 27.2–33.4)
MCHC: 34.6 g/dL (ref 32.0–36.0)
MCV: 87.8 fL (ref 79.3–98.0)
MONO#: 0.8 10*3/uL (ref 0.1–0.9)
MONO%: 8.5 % (ref 0.0–14.0)
NEUT#: 7.2 10*3/uL — ABNORMAL HIGH (ref 1.5–6.5)
NEUT%: 74.2 % (ref 39.0–75.0)
PLATELETS: 184 10*3/uL (ref 140–400)
RBC: 5.9 10*6/uL — ABNORMAL HIGH (ref 4.20–5.82)
RDW: 13.5 % (ref 11.0–14.6)
Retic %: 1.55 % (ref 0.80–1.80)
Retic Ct Abs: 91.45 10*3/uL (ref 34.80–93.90)
WBC: 9.7 10*3/uL (ref 4.0–10.3)
lymph#: 1.7 10*3/uL (ref 0.9–3.3)

## 2015-03-01 LAB — COMPREHENSIVE METABOLIC PANEL
ALBUMIN: 3.6 g/dL (ref 3.5–5.0)
ALK PHOS: 50 U/L (ref 40–150)
ALT: 39 U/L (ref 0–55)
AST: 25 U/L (ref 5–34)
Anion Gap: 13 mEq/L — ABNORMAL HIGH (ref 3–11)
BUN: 10.4 mg/dL (ref 7.0–26.0)
CALCIUM: 9.2 mg/dL (ref 8.4–10.4)
CO2: 27 mEq/L (ref 22–29)
Chloride: 92 mEq/L — ABNORMAL LOW (ref 98–109)
Creatinine: 0.9 mg/dL (ref 0.7–1.3)
EGFR: 87 mL/min/{1.73_m2} — AB (ref >90)
Glucose: 101 mg/dl (ref 70–140)
POTASSIUM: 3.9 meq/L (ref 3.5–5.1)
Sodium: 132 mEq/L — ABNORMAL LOW (ref 136–145)
Total Bilirubin: 0.49 mg/dL (ref 0.20–1.20)
Total Protein: 6.9 g/dL (ref 6.4–8.3)

## 2015-03-01 LAB — FERRITIN: FERRITIN: 248 ng/mL (ref 22–316)

## 2015-03-01 NOTE — Progress Notes (Signed)
Marland Kitchen    HEMATOLOGY/ONCOLOGY CONSULTATION NOTE  Date of Service: 03/01/2015  Patient Care Team: Denita Lung, MD as PCP - General  CHIEF COMPLAINTS/PURPOSE OF CONSULTATION:  polycythemia  HISTORY OF PRESENTING ILLNESS:   Russell Dudley is a wonderful 70 y.o. male who has been referred to [place] by Dr .Wyatt Haste, MD  for evaluation and management of polycythemia.  Patient has a history of hypertension, dyslipidemia and her current medicines at the meningioma which is currently being managed at [facility] by Dr. Encarnacion Slates. He was first diagnosed with a meningioma in March 2006 fifths subsequent treatments have been detailed in his  Oncologic history noted below from his last note by Dr Ferdinand Lango.  He was noted to have a normal hemoglobin and hematocrit in March 2016.  He has been a lifetime nonsmoker.  No history of sleep apnea. Has had extensive treatment for his indicative subjective meningiomas which have caused issues with right lower extremity proprioception. Started on Avastin 5 mg/kg every 2 weeks in March 2016 for clinical progression. This was transitioned to 5 mg/kg every 3 weeks in July 2016.  Avastin was held in October 2016 as per patient preference.  It was restarted at 10 mg/kg every 2 weeks from 02/02/2015 based on clinical symptoms of progression.  Patient notes that he is getting gradually weaned off dexamethasone.  He has gained 40-50 pounds since March 2016. Notes color change and coldness in his right lower extremity for the last couple of months.   He notes that he has been trying to work out at home in his home gym to try to maintain his muscle strength but is bothered by his right lower extremity dysfunction.  His hemoglobin/HCT level in March 2016 was normal 16.5/47.1 and has progressively increased as noted in labs below to hgb/hct of 18.8/55.9 as per labs from 02/19/2015 done at [facility].  On his labs today his hgb/hct is 17.9/51.8 which is improved  some. His WBC counts today are WNL @ 9.7k and his PLT counts are WNL @ 184k.  He notes no significant new abdominal pain.  No clinical significant palpable hepatosplenomegaly.  No night sweats.  No new bone pains. Wife endorses that he snores but no overt apneic episodes.  No significant anxiety over his right lower extremity color change and coolness issues with proprioception which are limiting his quality of life.   He wonders if it is a circulation problem.    MEDICAL HISTORY:  Past Medical History  Diagnosis Date  . Hypertension   . ASHD (arteriosclerotic heart disease)   . Dyslipidemia   . History of meningioma   . Allergic rhinitis    ONCOLOGIC HISTORY (copied from Dr Ferdinand Lango note) Recurrent bilateral parietal parasagittal meningiomas. (being treated at [place] [facility] by Dr Encarnacion Slates) - treatment history as per [facility] Notes   Treatment History:  03/23/04: The patient underwent resection of a parasagittal meningioma by Dr. Faythe Casa. According to his operative note he was able to remove nearly all of the tumor except for some that was adherent to the falx. The procedure was complicated by some brain swelling, which resolved during the case. Pathology was reportedly a grade I meningioma although we do not have the path reports documenting this.  08/03/04: The patient completed a course of stereotactic radiosurgery dosed at 12 cGy. He was followed with serial scans by his local physician during this time.  06/25/09: The patient followed up with his local neurosurgeon. At that  visit he reported having episodes of right-sided flickering vision as well as numbness in the right foot. These were attributed to seizures. A repeat MRI was completed, which showed increasing vasogenic edema and progressive mass. This was compared to previous scan done on 04/17/08.  07/27/09: The patient saw Dr. Derrill Memo in consultation. He proceeded with an MRV to determine if the sagittal  sinus was occluded.  08/11/09: He had embolization of the blood vessels near the tumor.  08/16/09: He underwent a near gross total resection of the tumor by Dr. Derrill Memo. According to his operating note he had to leave some of the tumor that was invading the normal brain tissues, but was able to resect much of the lesion. Pathology showed MENINGIOMA WITH BRAIN INVASION (WHO GRADE II). 09/28/09: New patient evaluation at [facility]. Serial MRI monitoring recommended. 11/30/09: 55.8 Gy fractionated radiotherapy to posterior parasagittal sinus lesion completed. 10/31/11: 14 Gy stereotactic radiosurgery to posterior falcine lesion completed. 03/20/14: In the context of worsening balance issues for 6 months, MRI found growth in tumor component closest to the splenium of the corpus callosum. Referral by Dr. Leonel Ramsay to [place]. 04/07/14: New patient evaluation at [facility]. Initiate Avastin 5mg /kg every 2 weeks. 08/03/14: Stable MRI. Transition to Avastin 5mg /kg IV Q 3 weeks. 10/26/14: Stable MRI. Monitor off therapy per patient preference. 02/02/15: Clinical progression, patient declines MRI. Start Avastin 10 mg/kg IV every 2 weeks.    SURGICAL HISTORY: Past Surgical History  Procedure Laterality Date  . Meningioma    . Colonoscopy  2008    Dr. Carlean Purl    SOCIAL HISTORY: Social History   Social History  . Marital Status: Married    Spouse Name: N/A  . Number of Children: N/A  . Years of Education: N/A   Occupational History  . Not on file.   Social History Main Topics  . Smoking status: Never Smoker   . Smokeless tobacco: Never Used  . Alcohol Use: 4.2 oz/week    7 Glasses of wine per week  . Drug Use: No  . Sexual Activity: Yes   Other Topics Concern  . Not on file   Social History Narrative    FAMILY HISTORY: No family history on file.  ALLERGIES:  is allergic to metoclopramide  hcl.  MEDICATIONS:  Current Outpatient Prescriptions  Medication Sig Dispense Refill  . clobazam (ONFI) 10 MG tablet Take 15 mg by mouth daily.    . clonazePAM (KLONOPIN) 0.5 MG tablet Take 0.5 mg by mouth 2 (two) times daily as needed.    Marland Kitchen dexamethasone (DECADRON) 4 MG tablet Take 1 tablet (4 mg total) by mouth 2 (two) times daily with a meal. (Patient taking differently: Take 2 mg by mouth. Take 2mg  by mouth daily in the morning and 1mg  daily in the evening.) 12 tablet 0  . lisinopril-hydrochlorothiazide (ZESTORETIC) 20-12.5 MG tablet Take 1 tablet by mouth daily. 90 tablet 3  . metoprolol succinate (TOPROL-XL) 25 MG 24 hr tablet Take 1 tablet by mouth  every day 90 tablet 3  . zonisamide (ZONEGRAN) 100 MG capsule Take 100 mg by mouth 3 (three) times daily.      No current facility-administered medications for this visit.    REVIEW OF SYSTEMS:    10 Point review of Systems was done is negative except as noted above.  PHYSICAL EXAMINATION: ECOG PERFORMANCE STATUS: 2 - Symptomatic, <50% confined to bed  .  Filed Vitals:   03/01/15 0857  BP: 132/82  Pulse: 69  Temp: 98.1 F (36.7 C)  Resp: 18   GENERAL:alert, in no acute distress and comfortable SKIN: no acute rashes, dusky appearance to RLE EYES: normal, conjunctiva are pink and non-injected, sclera clear OROPHARYNX:no exudate, no erythema and lips, buccal mucosa, and tongue normal  NECK: supple, no JVD, thyroid normal size, non-tender, without nodularity LYMPH:  no palpable lymphadenopathy in the cervical, axillary or inguinal LUNGS: clear to auscultation with normal respiratory effort HEART: regular rate & rhythm,  no murmurs and no lower extremity edema ABDOMEN: abdomen soft, non-tender, normoactive bowel sounds , no palpable hepato-splenomegaly. Musculoskeletal: dusky appearance to RLE, cool to touch compared to LLE and with some delay in capillary refill times. PSYCH: alert & oriented x 3 with fluent speech NEURO:  decreased RLE proprioception.  LABORATORY DATA:  I have reviewed the data as listed  . CBC Latest Ref Rng 11/25/2012 02/23/2010 02/22/2010  WBC 4.0 - 10.5 K/uL 8.8 - 8.7  Hemoglobin 13.0 - 17.0 g/dL 16.8 16.3 15.4  Hematocrit 39.0 - 52.0 % 46.8 48.0 42.9  Platelets 150 - 400 K/uL 350 - 213   . CBC    Component Value Date/Time   WBC 9.7 03/01/2015 1038   WBC 8.8 11/25/2012 1458   RBC 5.90* 03/01/2015 1038   RBC 5.56 11/25/2012 1458   HGB 17.9* 03/01/2015 1038   HGB 16.8 11/25/2012 1458   HCT 51.8* 03/01/2015 1038   HCT 46.8 11/25/2012 1458   PLT 184 03/01/2015 1038   PLT 350 11/25/2012 1458   MCV 87.8 03/01/2015 1038   MCV 84.2 11/25/2012 1458   MCH 30.3 03/01/2015 1038   MCH 30.2 11/25/2012 1458   MCHC 34.6 03/01/2015 1038   MCHC 35.9 11/25/2012 1458   RDW 13.5 03/01/2015 1038   RDW 12.9 11/25/2012 1458   LYMPHSABS 1.7 03/01/2015 1038   LYMPHSABS 2.9 11/25/2012 1458   MONOABS 0.8 03/01/2015 1038   MONOABS 0.7 11/25/2012 1458   EOSABS 0.0 03/01/2015 1038   EOSABS 0.3 11/25/2012 1458   BASOSABS 0.0 03/01/2015 1038   BASOSABS 0.1 11/25/2012 1458         . CMP Latest Ref Rng 11/11/2014 11/25/2012 02/24/2010  Glucose 65 - 99 mg/dL 100(H) 92 113(H)  BUN 7 - 25 mg/dL 9 11 5(L)  Creatinine 0.70 - 1.25 mg/dL 0.80 0.99 0.77  Sodium 135 - 146 mmol/L 135 131(L) 135  Potassium 3.5 - 5.3 mmol/L 4.6 4.4 4.1  Chloride 98 - 110 mmol/L 97(L) 95(L) 103  CO2 20 - 31 mmol/L 27 25 26   Calcium 8.6 - 10.3 mg/dL 9.2 9.4 8.6  Total Protein 6.0 - 8.3 g/dL - 6.8 -  Total Bilirubin 0.3 - 1.2 mg/dL - 0.4 -  Alkaline Phos 39 - 117 U/L - 73 -  AST 0 - 37 U/L - 19 -  ALT 0 - 53 U/L - 29 -     RADIOGRAPHIC STUDIES: I have personally reviewed the radiological images as listed and agreed with the findings in the report. No results found.  ASSESSMENT & PLAN:   70 yo caucasian male with   1) Atypical recurrent parasagital meningioma s/p extensive treatments as detailed above. Following  with Dr Encarnacion Slates at [facility] for management. Currently on Avastin 10mg /kg q 2weeks. Plan -continue f/u at [facility] for continued management.  2) Relatively new progressive Polycythemia since 03/2014. No overt palpable hepatosplenomegaly, leucocytosis or thrombocytosis to strongly suggest polycythemia vera but cannot rule  it out. -There is certainly evidence that certain Meningiomas can cause hypererythropoeitinemia leading to Polycythemia. Therefore patient polycythemia might be related to progression of his meningiomas. Levels today better than on 02/19/2015. -Central sleep apnea from multiple surgical/RT interventions compounded by weight gain and sedation from anti-seizures medications could certain be an issue. - relative DI unlikely given no hypernatremia and no clinical report of polyuria at this time. -hemoconcentration from dehydration cannot be ruled out. No acute new FND/dizziness/visual blurring/DOE/CP/SOB or AMS to suggest a hyperviscosity state.  3) RLE color change, paresthesias, delayed capillary refill Plan - no acute indication for therapeutic phlebotomy at this time -will send out Jak2 [id number] and Exon 12 mutation studies to r/o PCV -would recommend sleep study by Primary care physician -maintain adequate hydration -will check EPO levels -consider getting blood carboxyhgb levels -ASA 81mg  po daily if no contra-indications based on CNS disease/tumor characteristics. - US arterial of RLE to r/o significant arterial compromise (patient is high risk from polycythemia and the use of Avastin) -continue f/u with [facility] hematology to help with better care co-ordination with [facility]. -if PCV clear recommendation to consider therapeutic phlebotomy to maintain HCT< 45 -if secondary polycythemia - would treat underlying cause and the goals for consideration of therapeutic phlebotomy are less well defined but HCT of 52 might be a reasonable consideration or based on patients  preferences/vascular risk factors/symptoms.  Will call patient when PCV workup results available. Continue f/u with PCP and at [facility]  F/u with Dr Irene Limbo on an as needed basis if any new questions/concerns arise  All of the patients questions were answered with apparent satisfaction. The patient knows to call the clinic with any problems, questions or concerns.  I spent 60 minutes counseling the patient face to face. The total time spent in the appointment was 70 minutes and more than 50% was on counseling and direct patient cares.    Sullivan Lone MD [place] AAHIVMS [facility] Hematology/Oncology [facility]  (Office):       [phone] (Work cell):  [phone] (Fax):           [phone]  03/01/2015 8:49 AM

## 2015-03-01 NOTE — Telephone Encounter (Signed)
per pof to sch appt-gave pt copy of avs-sch [place] @ [facility]-gave pt appt time/date/location

## 2015-03-02 LAB — ERYTHROPOIETIN: ERYTHROPOIETIN: 5.3 m[IU]/mL (ref 2.6–18.5)

## 2015-03-24 ENCOUNTER — Other Ambulatory Visit: Payer: Self-pay | Admitting: *Deleted

## 2015-03-24 ENCOUNTER — Telehealth: Payer: Self-pay | Admitting: Family Medicine

## 2015-03-24 DIAGNOSIS — I10 Essential (primary) hypertension: Secondary | ICD-10-CM

## 2015-03-24 MED ORDER — LISINOPRIL-HYDROCHLOROTHIAZIDE 20-12.5 MG PO TABS: 1.0000 | ORAL_TABLET | Freq: Every day | ORAL | Status: DC

## 2015-03-24 NOTE — Telephone Encounter (Signed)
Pt's wife called because they received notification that we called in lisinopril/hctz RX to pharmacy.  His MD at [facility] just increased his dosage to 20-25mg , they are trying that for 90 days to see how he does. I advised her that it was a refill request we received from her pharmacy ( probably auto refills) that she could just [facility] for now to see if he needs to go back to that dose They wanted you to know about the increase

## 2015-03-24 NOTE — Telephone Encounter (Signed)
Rcvd refill request for Lisinopril

## 2015-03-26 MED ORDER — LISINOPRIL-HYDROCHLOROTHIAZIDE 20-12.5 MG PO TABS: 1.0000 | ORAL_TABLET | Freq: Every day | ORAL | Status: DC

## 2015-05-13 ENCOUNTER — Encounter: Payer: Self-pay | Admitting: Family Medicine

## 2015-05-14 ENCOUNTER — Telehealth: Payer: Self-pay | Admitting: Family Medicine

## 2015-05-14 ENCOUNTER — Other Ambulatory Visit: Payer: Self-pay | Admitting: Medical

## 2015-05-14 MED ORDER — LISINOPRIL 20 MG PO TABS: 20.0000 mg | ORAL_TABLET | Freq: Every day | ORAL | Status: DC

## 2015-05-14 NOTE — Telephone Encounter (Signed)
Done, plain Lisinopril sent

## 2015-05-14 NOTE — Telephone Encounter (Signed)
Pt wife called and states that Dr Redmond School was suppose to change his lisinopril htz to regular lisinopril, she emailed him through my chart and he said he would do it but has not done it yet, [facility] [address] [facility], She can be reached at [phone] with any questions. informed her that Dr Redmond School was out of the office till Monday.

## 2015-05-14 NOTE — Telephone Encounter (Signed)
Informed pt's wife that rx was sent in

## 2015-06-23 ENCOUNTER — Encounter: Payer: Self-pay | Admitting: Family Medicine

## 2015-06-23 MED ORDER — LISINOPRIL 20 MG PO TABS: 20.0000 mg | ORAL_TABLET | Freq: Every day | ORAL | Status: DC

## 2015-08-11 ENCOUNTER — Other Ambulatory Visit: Payer: Self-pay | Admitting: Family Medicine

## 2015-09-15 ENCOUNTER — Other Ambulatory Visit: Payer: Self-pay | Admitting: Family Medicine

## 2015-10-11 ENCOUNTER — Telehealth: Payer: Self-pay

## 2015-10-11 NOTE — Telephone Encounter (Signed)
Wife called to let us know that pt's BP is running 150/90s since starting Azvastin (sp) for brain tumor. Wife reports pt had to have 2 clondine before getting chemo treatment. Pt ended up not getting treatment. Wife questions if you can prescribe or make changes to BP remedy.  [phone]. Victorino December

## 2015-10-11 NOTE — Telephone Encounter (Signed)
Wife said he was on 20/12.5 before had to come off due to sodium and B/P was still high when he was taking this

## 2015-10-11 NOTE — Telephone Encounter (Signed)
He looks like he is on lisinopril 20. Increase it to 20/12.5 and have him come in here in about 2 weeks

## 2015-10-11 NOTE — Telephone Encounter (Signed)
Before going to put him on a different medicine, he'll need to commensal he can discuss this. If he is keeping track of his blood pressure have him bring the readings as well as his blood pressure cuff.

## 2015-10-12 MED ORDER — AMLODIPINE BESYLATE 5 MG PO TABS: 5.0000 mg | ORAL_TABLET | Freq: Every day | ORAL | 3 refills | Status: DC

## 2015-10-12 NOTE — Telephone Encounter (Signed)
Dr.Lalonde did you see this

## 2015-10-12 NOTE — Telephone Encounter (Signed)
He has been having blood pressure problems especially in regard to going to [facility] to get therapy. I will add Norvasc to his regimen to see if it'll help. Apparently they did try to raise his lisinopril to 30 mg and he had difficulty with that. I will add Norvasc and they are to call me in 2 weeks.

## 2015-10-12 NOTE — Telephone Encounter (Signed)
Pt's wife, Vaughan Basta called back stating that pt's health has declined due to tumors and it is difficult to move him around. He has fallen several times in the past couple of weeks. Per wife, pt's doctor at [facility], Dr Lorri Frederick, has asked that Dr Redmond School page her at [phone] to discuss bp and options

## 2015-10-14 ENCOUNTER — Other Ambulatory Visit: Payer: Self-pay

## 2015-10-14 DIAGNOSIS — D420 Neoplasm of uncertain behavior of cerebral meninges: Secondary | ICD-10-CM

## 2015-10-18 ENCOUNTER — Telehealth: Payer: Self-pay | Admitting: Family Medicine

## 2015-10-18 NOTE — Telephone Encounter (Signed)
Pts wife called about home health, evaluation  she has not heard anything from this and was just wondering about it, pt wife can be reached at [phone],

## 2015-10-19 NOTE — Telephone Encounter (Signed)
Make sure this gets done

## 2015-10-19 NOTE — Telephone Encounter (Signed)
Russell Dudley I know we done this and its in EPIC what is their # so I can call them and find out what is going on

## 2015-10-20 NOTE — Telephone Encounter (Signed)
Spoke to scott and he came by yesterday for this. Scott with [employer] is working on this

## 2015-10-25 ENCOUNTER — Telehealth: Payer: Self-pay

## 2015-10-25 MED ORDER — LISINOPRIL 20 MG PO TABS: 20.0000 mg | ORAL_TABLET | Freq: Every day | ORAL | 0 refills | Status: DC

## 2015-10-25 NOTE — Telephone Encounter (Signed)
Request refill of Lisinopril to [facility]

## 2015-11-29 ENCOUNTER — Other Ambulatory Visit: Payer: Self-pay

## 2015-11-29 ENCOUNTER — Telehealth: Payer: Self-pay | Admitting: Family Medicine

## 2015-11-29 MED ORDER — AMLODIPINE BESYLATE 5 MG PO TABS
5.0000 mg | ORAL_TABLET | Freq: Every day | ORAL | 1 refills | Status: DC
Start: 2015-11-29 — End: 2016-01-21

## 2015-11-29 NOTE — Telephone Encounter (Signed)
Med sent in.

## 2015-11-29 NOTE — Telephone Encounter (Signed)
Requesting refill on Amlodipine 5 mg to [facility]

## 2015-12-23 ENCOUNTER — Other Ambulatory Visit: Payer: Self-pay | Admitting: Family Medicine

## 2015-12-28 ENCOUNTER — Encounter: Payer: Self-pay | Admitting: Family Medicine

## 2016-01-21 ENCOUNTER — Telehealth: Payer: Self-pay | Admitting: Family Medicine

## 2016-01-21 MED ORDER — METOPROLOL SUCCINATE ER 50 MG PO TB24: 50.0000 mg | ORAL_TABLET | Freq: Every day | ORAL | 3 refills | Status: AC

## 2016-01-21 NOTE — Telephone Encounter (Signed)
Pt wife called and state that [place] feet and ankles are swelling up and thinks it is because of the amlodipine because it has it has norvasc in it states it happed about 10 years ago,wants to know if it is ok to stop this medication, states his bp is running 148/88 please advise, thinks maybe having a allergic reaction, she would like someone to please give her a call, best number is [phone]  And [id number] [place], [place] - [address]

## 2016-01-21 NOTE — Telephone Encounter (Signed)
They would like to try something else

## 2016-01-21 NOTE — Telephone Encounter (Signed)
Have him double his present Toprol dosing to 50 mg until it runs out. I called in the 50 mg dosing. Need to recheck his blood pressure in about one month.

## 2016-01-21 NOTE — Telephone Encounter (Signed)
Explained to her that that is not unusual for the medicine to do that. It is not an allergic reaction but it does cause some swelling. It's not a dangerous, more of a nuisance. I can switch to a different medicine if they would like

## 2016-01-21 NOTE — Telephone Encounter (Signed)
Pt wife states he has been like this for a couple of weeks,

## 2016-01-21 NOTE — Telephone Encounter (Signed)
The amlodipine is causing some swelling. I will stop that and have him double his Toprol to 80 mg daily.

## 2016-01-21 NOTE — Telephone Encounter (Signed)
Russell Dudley advised and she verbalized understanding

## 2016-01-31 ENCOUNTER — Encounter: Payer: Self-pay | Admitting: Family Medicine

## 2016-02-07 ENCOUNTER — Other Ambulatory Visit: Payer: Self-pay | Admitting: Family Medicine

## 2016-02-07 NOTE — Telephone Encounter (Signed)
Is this okay to refill dont know if pt is taking

## 2016-02-13 ENCOUNTER — Other Ambulatory Visit (HOSPITAL_COMMUNITY): Payer: 59

## 2016-02-13 ENCOUNTER — Inpatient Hospital Stay (HOSPITAL_COMMUNITY)
Admission: EM | Admit: 2016-02-13 | Discharge: 2016-02-15 | DRG: 054 | Disposition: A | Discharge status: Home Health | Payer: 59 | Attending: Internal Medicine | Admitting: Internal Medicine

## 2016-02-13 ENCOUNTER — Encounter (HOSPITAL_COMMUNITY): Payer: Self-pay | Admitting: Emergency Medicine

## 2016-02-13 ENCOUNTER — Observation Stay (HOSPITAL_COMMUNITY): Payer: 59

## 2016-02-13 ENCOUNTER — Emergency Department (HOSPITAL_COMMUNITY): Payer: 59

## 2016-02-13 DIAGNOSIS — D320 Benign neoplasm of cerebral meninges: Secondary | ICD-10-CM | POA: Diagnosis not present

## 2016-02-13 DIAGNOSIS — D329 Benign neoplasm of meninges, unspecified: Secondary | ICD-10-CM

## 2016-02-13 DIAGNOSIS — Z66 Do not resuscitate: Secondary | ICD-10-CM | POA: Diagnosis present

## 2016-02-13 DIAGNOSIS — R5381 Other malaise: Secondary | ICD-10-CM

## 2016-02-13 DIAGNOSIS — E871 Hypo-osmolality and hyponatremia: Secondary | ICD-10-CM | POA: Diagnosis present

## 2016-02-13 DIAGNOSIS — K5900 Constipation, unspecified: Secondary | ICD-10-CM | POA: Diagnosis present

## 2016-02-13 DIAGNOSIS — I10 Essential (primary) hypertension: Secondary | ICD-10-CM | POA: Diagnosis present

## 2016-02-13 DIAGNOSIS — Z79899 Other long term (current) drug therapy: Secondary | ICD-10-CM

## 2016-02-13 DIAGNOSIS — D420 Neoplasm of uncertain behavior of cerebral meninges: Secondary | ICD-10-CM | POA: Diagnosis not present

## 2016-02-13 DIAGNOSIS — F329 Major depressive disorder, single episode, unspecified: Secondary | ICD-10-CM | POA: Diagnosis present

## 2016-02-13 DIAGNOSIS — E785 Hyperlipidemia, unspecified: Secondary | ICD-10-CM | POA: Diagnosis present

## 2016-02-13 DIAGNOSIS — Z888 Allergy status to other drugs, medicaments and biological substances status: Secondary | ICD-10-CM

## 2016-02-13 DIAGNOSIS — C709 Malignant neoplasm of meninges, unspecified: Secondary | ICD-10-CM

## 2016-02-13 DIAGNOSIS — Z7189 Other specified counseling: Secondary | ICD-10-CM

## 2016-02-13 DIAGNOSIS — F419 Anxiety disorder, unspecified: Secondary | ICD-10-CM | POA: Diagnosis present

## 2016-02-13 DIAGNOSIS — E860 Dehydration: Secondary | ICD-10-CM | POA: Diagnosis present

## 2016-02-13 DIAGNOSIS — R112 Nausea with vomiting, unspecified: Secondary | ICD-10-CM

## 2016-02-13 DIAGNOSIS — Z515 Encounter for palliative care: Secondary | ICD-10-CM

## 2016-02-13 DIAGNOSIS — G936 Cerebral edema: Secondary | ICD-10-CM | POA: Diagnosis present

## 2016-02-13 DIAGNOSIS — R110 Nausea: Secondary | ICD-10-CM

## 2016-02-13 DIAGNOSIS — Z7401 Bed confinement status: Secondary | ICD-10-CM

## 2016-02-13 DIAGNOSIS — I2510 Atherosclerotic heart disease of native coronary artery without angina pectoris: Secondary | ICD-10-CM | POA: Diagnosis present

## 2016-02-13 HISTORY — DX: Unspecified convulsions: R56.9

## 2016-02-13 HISTORY — DX: Malignant (primary) neoplasm, unspecified: C80.1

## 2016-02-13 HISTORY — DX: Personal history of other medical treatment: Z92.89

## 2016-02-13 LAB — CBC WITH DIFFERENTIAL/PLATELET
Basophils Absolute: 0 10*3/uL (ref 0.0–0.1)
Basophils Relative: 0 %
Eosinophils Absolute: 0 10*3/uL (ref 0.0–0.7)
Eosinophils Relative: 0 %
HCT: 49.7 % (ref 39.0–52.0)
Hemoglobin: 18.3 g/dL — ABNORMAL HIGH (ref 13.0–17.0)
Lymphocytes Relative: 12 %
Lymphs Abs: 1.3 10*3/uL (ref 0.7–4.0)
MCH: 38.5 pg — ABNORMAL HIGH (ref 26.0–34.0)
MCHC: 36.8 g/dL — ABNORMAL HIGH (ref 30.0–36.0)
MCV: 104.6 fL — ABNORMAL HIGH (ref 78.0–100.0)
Monocytes Absolute: 1.4 10*3/uL — ABNORMAL HIGH (ref 0.1–1.0)
Monocytes Relative: 13 %
Neutro Abs: 8 10*3/uL — ABNORMAL HIGH (ref 1.7–7.7)
Neutrophils Relative %: 75 %
Platelets: 207 10*3/uL (ref 150–400)
RBC: 4.75 MIL/uL (ref 4.22–5.81)
RDW: 16 % — ABNORMAL HIGH (ref 11.5–15.5)
Smear Review: ADEQUATE
WBC: 10.7 10*3/uL — ABNORMAL HIGH (ref 4.0–10.5)

## 2016-02-13 LAB — COMPREHENSIVE METABOLIC PANEL
ALT: 14 U/L — ABNORMAL LOW (ref 17–63)
AST: 20 U/L (ref 15–41)
Albumin: 2.8 g/dL — ABNORMAL LOW (ref 3.5–5.0)
Alkaline Phosphatase: 43 U/L (ref 38–126)
Anion gap: 10 (ref 5–15)
BUN: 6 mg/dL (ref 6–20)
CO2: 23 mmol/L (ref 22–32)
Calcium: 8.9 mg/dL (ref 8.9–10.3)
Chloride: 97 mmol/L — ABNORMAL LOW (ref 101–111)
Creatinine, Ser: 0.81 mg/dL (ref 0.61–1.24)
GFR calc Af Amer: >60 mL/min (ref >60)
GFR calc non Af Amer: >60 mL/min (ref >60)
Glucose, Bld: 103 mg/dL — ABNORMAL HIGH (ref 65–99)
Potassium: 4.2 mmol/L (ref 3.5–5.1)
Sodium: 130 mmol/L — ABNORMAL LOW (ref 135–145)
Total Bilirubin: 0.8 mg/dL (ref 0.3–1.2)
Total Protein: 4.9 g/dL — ABNORMAL LOW (ref 6.5–8.1)

## 2016-02-13 MED ORDER — SODIUM CHLORIDE 0.9 % IV SOLN
INTRAVENOUS | Status: DC
  Administered 2016-02-13 – 2016-02-14 (×2): via INTRAVENOUS

## 2016-02-13 MED ORDER — DEXAMETHASONE SODIUM PHOSPHATE 10 MG/ML IJ SOLN
10.0000 mg | Freq: Once | INTRAMUSCULAR | Status: AC
  Administered 2016-02-13: 10 mg via INTRAVENOUS
  Filled 2016-02-13: qty 1

## 2016-02-13 MED ORDER — LISINOPRIL 20 MG PO TABS
20.0000 mg | ORAL_TABLET | Freq: Every day | ORAL | Status: DC
  Administered 2016-02-13 – 2016-02-15 (×3): 20 mg via ORAL
  Filled 2016-02-13 (×3): qty 1

## 2016-02-13 MED ORDER — DEXAMETHASONE 4 MG PO TABS: 4.0000 mg | ORAL_TABLET | Freq: Three times a day (TID) | ORAL | Status: DC

## 2016-02-13 MED ORDER — FLEET ENEMA 7-19 GM/118ML RE ENEM: 1.0000 | ENEMA | Freq: Once | RECTAL | Status: DC | PRN

## 2016-02-13 MED ORDER — ONDANSETRON HCL 4 MG/2ML IJ SOLN: 4.0000 mg | Freq: Four times a day (QID) | INTRAMUSCULAR | Status: DC | PRN

## 2016-02-13 MED ORDER — AMLODIPINE BESYLATE 5 MG PO TABS
5.0000 mg | ORAL_TABLET | Freq: Every day | ORAL | Status: DC
  Administered 2016-02-13 – 2016-02-15 (×3): 5 mg via ORAL
  Filled 2016-02-13 (×3): qty 1

## 2016-02-13 MED ORDER — ZONISAMIDE 100 MG PO CAPS
300.0000 mg | ORAL_CAPSULE | Freq: Every day | ORAL | Status: DC
  Administered 2016-02-13 – 2016-02-14 (×2): 300 mg via ORAL
  Filled 2016-02-13 (×2): qty 3

## 2016-02-13 MED ORDER — BISACODYL 5 MG PO TBEC
5.0000 mg | DELAYED_RELEASE_TABLET | Freq: Every day | ORAL | Status: DC | PRN
  Administered 2016-02-14: 5 mg via ORAL
  Filled 2016-02-13 (×2): qty 1

## 2016-02-13 MED ORDER — SERTRALINE HCL 50 MG PO TABS
50.0000 mg | ORAL_TABLET | Freq: Every day | ORAL | Status: DC
  Administered 2016-02-13 – 2016-02-14 (×2): 50 mg via ORAL
  Filled 2016-02-13 (×2): qty 1

## 2016-02-13 MED ORDER — ONDANSETRON HCL 4 MG PO TABS: 4.0000 mg | ORAL_TABLET | Freq: Four times a day (QID) | ORAL | Status: DC | PRN

## 2016-02-13 MED ORDER — GADOBENATE DIMEGLUMINE 529 MG/ML IV SOLN
15.0000 mL | Freq: Once | INTRAVENOUS | Status: AC | PRN
  Administered 2016-02-13: 15 mL via INTRAVENOUS

## 2016-02-13 MED ORDER — SODIUM CHLORIDE 0.9 % IV BOLUS (SEPSIS)
500.0000 mL | Freq: Once | INTRAVENOUS | Status: AC
  Administered 2016-02-13: 500 mL via INTRAVENOUS

## 2016-02-13 MED ORDER — HYDROCORTISONE 5 MG PO TABS
15.0000 mg | ORAL_TABLET | Freq: Two times a day (BID) | ORAL | Status: DC
  Administered 2016-02-13 – 2016-02-15 (×4): 15 mg via ORAL
  Filled 2016-02-13 (×4): qty 1

## 2016-02-13 MED ORDER — METOPROLOL SUCCINATE ER 50 MG PO TB24
50.0000 mg | ORAL_TABLET | Freq: Every day | ORAL | Status: DC
  Administered 2016-02-13 – 2016-02-15 (×3): 50 mg via ORAL
  Filled 2016-02-13 (×3): qty 1

## 2016-02-13 MED ORDER — CLONAZEPAM 0.5 MG PO TABS: 0.5000 mg | ORAL_TABLET | Freq: Two times a day (BID) | ORAL | Status: DC | PRN

## 2016-02-13 MED ORDER — DEXAMETHASONE SODIUM PHOSPHATE 4 MG/ML IJ SOLN
4.0000 mg | Freq: Four times a day (QID) | INTRAMUSCULAR | Status: DC
  Administered 2016-02-13 – 2016-02-15 (×8): 4 mg via INTRAVENOUS
  Filled 2016-02-13 (×8): qty 1

## 2016-02-13 MED ORDER — ONDANSETRON HCL 4 MG/2ML IJ SOLN
4.0000 mg | Freq: Once | INTRAMUSCULAR | Status: AC
  Administered 2016-02-13: 4 mg via INTRAVENOUS
  Filled 2016-02-13: qty 2

## 2016-02-13 MED ORDER — CLOBAZAM 10 MG PO TABS
15.0000 mg | ORAL_TABLET | Freq: Every day | ORAL | Status: DC
  Administered 2016-02-13 – 2016-02-14 (×2): 15 mg via ORAL
  Filled 2016-02-13 (×2): qty 2

## 2016-02-13 MED ORDER — POLYETHYLENE GLYCOL 3350 17 G PO PACK: 17.0000 g | PACK | Freq: Every day | ORAL | Status: DC | PRN

## 2016-02-13 MED ORDER — CLOBAZAM 10 MG PO TABS: 15.0000 mg | ORAL_TABLET | Freq: Every day | ORAL | Status: DC

## 2016-02-13 MED ORDER — ONDANSETRON HCL 4 MG/2ML IJ SOLN: 4.0000 mg | Freq: Once | INTRAMUSCULAR | Status: DC

## 2016-02-13 NOTE — ED Triage Notes (Signed)
Patient from home with PTAR complaining of nausea and vomiting that started at noon yesterday.  Patient is a cancer patient at [facility] for a brain tumor, has chemo infusions every other week x2 years but missed last infusion due to snow.  Patient is alert and oriented and in no apparent distress at this time.  Patient currently has no complaint of pain.

## 2016-02-13 NOTE — H&P (Signed)
[facility] History and Physical  Russell Dudley [id number] DOB: [birth year] DOA: 02/13/2016  Referring physician: ED MD PCP: Wyatt Haste, MD   Chief Complaint: intractable n/v  HPI: Russell Dudley is a 71 y.o. male with history of hypertension, dyslipidemia, Recurrent atypical meningioma, status post resections 2 in the past, gamma- knife radiation and currently he was getting Avastin chemotherapy every other week at [facility], initially started March 2016, presents to ER for severed intractable n/v since last night.  Of note, every time he tried to drink or eat, he would have emesis.  His last chemorx session was on 01/21/2016. He was unable to get his second session in January due to the weather/snow we recently experienced.  Life long nonsmoker.  Of note, he has had extensive treatment for his meningiomas which has caused him to have right lower extremity proprioception.  He currently is on decadron 1mg  qam and cortef 15mg  po bid. Of note, the last few times that patient and wife has gone to [facility], they have had a difficult very difficult time transferring him because he is unable to ambulate on his own.  He is currently fairly bedbound now at home.  Wife has to use a Holter to help transfer him.  No f/c/cough/sick contacts/recent travels.  Wife/dgt present at bedside. Lengthy discussion w/ pt and family, pt is cognitive intact and wishes no heroic measures if his heart stops or he should stop breathing. He is made DNR.  Wife asked for ppwk assistance for OOH dnr.    Review of Systems:  Per hpi, o/w all systems reviewed and negative.  Past Medical History:  Diagnosis Date  . Allergic rhinitis   . ASHD (arteriosclerotic heart disease)   . Cancer [facility])    brain tumor  . Dyslipidemia   . History of meningioma   . Hypertension    Past Surgical History:  Procedure Laterality Date  . COLONOSCOPY  2008   Dr. Carlean Purl  . meningioma     Social History:  reports that he has  never smoked. He has never used smokeless tobacco. He reports that he drinks about 4.2 oz of alcohol per week . He reports that he does not use drugs.  Allergies  Allergen Reactions  . Metoclopramide Hcl     Muscle spasms     No family history on file. no family hx pertinent to case.    Prior to Admission medications   Medication Sig Start Date End Date Taking? Authorizing Provider  amLODipine (NORVASC) 5 MG tablet TAKE 1 TABLET BY MOUTH  DAILY 02/07/16  Yes Denita Lung, MD  clobazam (ONFI) 10 MG tablet Take 15 mg by mouth daily.   Yes Historical Provider, MD  clonazePAM (KLONOPIN) 0.5 MG tablet Take 0.5 mg by mouth 2 (two) times daily as needed.   Yes Historical Provider, MD  dexamethasone (DECADRON) 1 MG tablet Take 1 mg by mouth daily with breakfast.   Yes Historical Provider, MD  hydrocortisone (CORTEF) 5 MG tablet Take 15 mg by mouth 2 (two) times daily.   Yes Historical Provider, MD  lisinopril (PRINIVIL,ZESTRIL) 20 MG tablet TAKE 1 TABLET BY MOUTH  DAILY 12/23/15  Yes Denita Lung, MD  metoprolol succinate (TOPROL-XL) 50 MG 24 hr tablet Take 1 tablet (50 mg total) by mouth daily. Take with or immediately following a meal. 01/21/16  Yes Denita Lung, MD  ondansetron (ZOFRAN) 8 MG tablet Take 8 mg by mouth every 8 (eight) hours as needed for  nausea or vomiting.   Yes Historical Provider, MD  [facility]. Last treatment 01-21-16. Followed by Dr Encarnacion Slates:   Yes Historical Provider, MD  sertraline (ZOLOFT) 50 MG tablet Take 50 mg by mouth at bedtime.   Yes Historical Provider, MD  zonisamide (ZONEGRAN) 100 MG capsule Take 300 mg by mouth at bedtime.    Yes Historical Provider, MD   Physical Exam: Vitals:   02/13/16 0950 02/13/16 1000 02/13/16 1015 02/13/16 1130  BP: (!) 190/107 172/90 (!) 174/106 (!) 166/103  Pulse: 69 (!) 59 66 64  Resp: 18     Temp: 98.1 F (36.7 C)     TempSrc: Oral     SpO2: 98% 97% 97% 99%  Weight: 81.6 kg (180 lb)       Height: 5\' 9"  (1.753 m)       Wt Readings from Last 3 Encounters:  02/13/16 81.6 kg (180 lb)  01/28/15 89.4 kg (197 lb)  11/25/12 89.8 kg (198 lb)    General:  Appears calm and comfortable, pleasant, NAD, AAOx3, morbid obese. Eyes: PERRL, normal lids, irises & conjunctiva ENT: grossly normal hearing, lips & tongue, mmm, tongue is midline. Neck: no LAD, no jvp Cardiovascular: RRR, no m/r/g. No LE edema.  Neg Homan's sign bilat. Telemetry: SR, no arrhythmias  Respiratory: CTA bilaterally, no w/r/r. Normal respiratory effort. Abdomen: soft, obese, ntnd Skin: no rash or induration seen on limited exam Musculoskeletal: grossly normal tone BUE/BLE Psychiatric: grossly normal mood and affect, speech fluent and appropriate Neurologic: grossly non-focal.            Labs on Admission:  Basic Metabolic Panel:  Recent Labs Lab 02/13/16 1032  NA 130*  K 4.2  CL 97*  CO2 23  GLUCOSE 103*  BUN 6  CREATININE 0.81  CALCIUM 8.9   Liver Function Tests:  Recent Labs Lab 02/13/16 1032  AST 20  ALT 14*  ALKPHOS 43  BILITOT 0.8  PROT 4.9*  ALBUMIN 2.8*   No results for input(s): LIPASE, AMYLASE in the last 168 hours. No results for input(s): AMMONIA in the last 168 hours. CBC:  Recent Labs Lab 02/13/16 1032  WBC 10.7*  NEUTROABS 8.0*  HGB 18.3*  HCT 49.7  MCV 104.6*  PLT 207   Cardiac Enzymes: No results for input(s): CKTOTAL, CKMB, CKMBINDEX, TROPONINI in the last 168 hours.  BNP (last 3 results) No results for input(s): BNP in the last 8760 hours.  ProBNP (last 3 results) No results for input(s): PROBNP in the last 8760 hours.  CBG: No results for input(s): GLUCAP in the last 168 hours.  Radiological Exams on Admission: Ct Head Wo Contrast  Result Date: 02/13/2016 CLINICAL DATA:  History of meningioma with resection and radiation. Nausea and vomiting. EXAM: CT HEAD WITHOUT CONTRAST TECHNIQUE: Contiguous axial images were obtained from the base of the skull  through the vertex without intravenous contrast. COMPARISON:  CT head 02/22/2010, MRI 04/17/2007 FINDINGS: Brain: Extensive vasogenic edema in the posterior frontal and parietal lobes bilaterally, left greater than right. This has progressed since prior studies. There has been prior left parietal craniotomy for tumor resection. Based on the MRI of 04/17/2007, there was residual meningioma along the posterior falx. This is difficult to evaluate without contrast on CT, but there appears to be hyperdensity in this area on CT consistent with tumor which may have enlarged in the interval. Further evaluation with MRI with contrast is suggested. Compression of the posterolateral ventricles due to edema.  No hydrocephalus or shift of the midline structures. Negative for intracranial hemorrhage. Vascular: No hyperdense vessel or unexpected calcification. Skull: Left parietal craniotomy. Craniotomy flap in satisfactory position. Sinuses/Orbits: Mucosal edema and bony thickening of the right maxillary sinus due to chronic sinusitis. Mucosal edema in the ethmoid sinuses. Other: None IMPRESSION: Extensive vasogenic edema in the posterior frontal and parietal lobes bilaterally, left greater than right. This pattern is most consistent with recurrent meningioma. There is posterior parafalcine meningioma on prior MRI 2009. This would be better evaluated with a follow-up MRI of brain without with contrast at this time. Electronically Signed   By: Franchot Gallo M.D.   On: 02/13/2016 11:43    EKG: no ekg  EKG Interpretation  Date/Time:    Ventricular Rate:    PR Interval:    QRS Duration:   QT Interval:    QTC Calculation:   R Axis:     Text Interpretation:         Mri brain 11/22/15 at [place]: Slight interval increase in size of irregular parietal-occipital parafalcine mass and dural based mass underlying craniotomy flap with stable surrounding T2/FLAIR signal. Compared with examination from 10/26/2014,  this enhancing mass has been slowly growing.   Assessment/Plan Principal Problem:   Atypical meningioma of brain (HCC) Active Problems:   Hypertension   Meningioma (HCC)   N&V (nausea and vomiting)   Debility   1. Recurrent atypical meningioma of brain w/ intractable n/v, w recurrent meningeoma noted on CT head w/ Extensive vasogenic edema in the posterior frontal and parietal lobes bilaterally, left greater than right.  - obs tele - likely progression of diease, either near hospice or close to it, and the edema causing n/v - er ordered decadron 10mg  iv x 1, continue 4mg  iv q6 for now - continue cortef 15bid, no signs of impending adrenal insufficiency/crisis - mri brain ordered to eval progression - palliative care c/s, appreciate Dr Rowe Pavy assistance - lengthy discussion w/ pt, made dnr,  - sw consult for OOH dnr ppwk for family - would like to see if steroids and gentle hydration would improve pt's debility enough to f/u w/ his Onc at [facility]. He has appt 2/6, but recd pt's wife call for earlier appt. For more lengthy discussion of treatment options, if there are any. - antiemetics ordered - gentle iv hydration.  2. Htn, elevated, in setting of progression of meningiomas - continue norvasc 5, metoprolol 50qd, lisinopril 20 qd - may improve some w/ high dose steroids - adjust rx as needed  3. gen weakness and worsening debility - wife has had very hard time moving pt and getting him to his Onc appts due to severe debility - I suspect his disease process has progressed and worsened this. - fu w/ MRI brain.  4. Anxiety/depresion - continue zoloft, klonopin prn  5. N/v - see #1, antiemetics - cld, advance diet as tolerated  C/s Dr Rowe Pavy, I spoke to her on phone, appreciate assistance.  Code Status: dnr DVT Prophylaxis:  scds for now, Family Communication: pt, wife and dgt at bedside Disposition Plan: obs tele  Time spent: 69mins  Maren Reamer MD., MBA/MHA [facility] Pager [phone]

## 2016-02-13 NOTE — ED Notes (Signed)
Patient transported to CT 

## 2016-02-13 NOTE — ED Notes (Signed)
Holding medications pending pharmacy verification.

## 2016-02-13 NOTE — ED Notes (Signed)
Patient placed on continuous pulse oximetry and blood pressure cuff; warm blankets given; visitor at bedside

## 2016-02-13 NOTE — ED Provider Notes (Signed)
[facility] DEPT Provider Note   CSN: [id number] Arrival date & time: 02/13/16  0944     History   Chief Complaint Chief Complaint  Patient presents with  . Nausea    HPI RYNELL CARNER is a 71 y.o. male.  HPI Patient is on chemotherapy for meningiomas. Reportedly has been worsening on chemotherapy. He has been having more difficulty getting around. Can no longer get in and out of a even with assistance.discussed with patient and family. This will be fatal. Once he is no longer tolerating the treatment or no longer improving him at all will want to go on hospice. No fevers. No dysuria. Has had nausea and vomiting. Has had a decreased appetite. Has a slight dull headache. Reportedly had told his wife at home but it was rather severe headache. Had a bowel movement yesterday.   Past Medical History:  Diagnosis Date  . Allergic rhinitis   . ASHD (arteriosclerotic heart disease)   . Cancer [facility])    brain tumor  . Dyslipidemia   . History of meningioma   . Hypertension     Patient Active Problem List   Diagnosis Date Noted  . Meningioma ([place]) 02/13/2016  . Polycythemia vera ([place]) 03/01/2015  . Leg pain, right 03/01/2015  . 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 Abnormality of gait 03/02/2014  . 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 Hypertension 05/18/2011  . Statin intolerance 05/18/2011  . ASHD (arteriosclerotic heart disease) 05/18/2011  . Hyperlipidemia LDL goal < 70 05/18/2011  . Atypical meningioma of brain ([place]) 05/18/2011    Past Surgical History:  Procedure Laterality Date  . COLONOSCOPY  2008   Dr. Carlean Purl  . meningioma         Home Medications    Prior to Admission medications   Medication Sig Start Date End Date Taking? Authorizing Provider  amLODipine (NORVASC) 5 MG tablet TAKE 1 TABLET BY MOUTH  DAILY 02/07/16   Denita Lung, MD  clobazam (ONFI) 10 MG tablet Take 15 mg by mouth  daily.    Historical Provider, MD  clonazePAM (KLONOPIN) 0.5 MG tablet Take 0.5 mg by mouth 2 (two) times daily as needed.    Historical Provider, MD  dexamethasone (DECADRON) 4 MG tablet Take 1 tablet (4 mg total) by mouth 2 (two) times daily with a meal. Patient taking differently: Take 2 mg by mouth. Take 2mg  by mouth daily in the morning and 1mg  daily in the evening. 01/28/15   Denita Lung, MD  lisinopril (PRINIVIL,ZESTRIL) 20 MG tablet TAKE 1 TABLET BY MOUTH  DAILY 12/23/15   Denita Lung, MD  metoprolol succinate (TOPROL-XL) 50 MG 24 hr tablet Take 1 tablet (50 mg total) by mouth daily. Take with or immediately following a meal. 01/21/16   Denita Lung, MD  zonisamide (ZONEGRAN) 100 MG capsule Take 100 mg by mouth 3 (three) times daily.     Historical Provider, MD    Family History No family history on file.  Social History Social History  Substance Use Topics  . Smoking status: Never Smoker  . Smokeless tobacco: Never Used  . Alcohol use 4.2 oz/week    7 Glasses of wine per week     Allergies   Metoclopramide hcl   Review of Systems Review of Systems  Constitutional: Positive for appetite change.  HENT: Negative for congestion.   Respiratory: Negative for shortness of breath.   Cardiovascular: Negative for chest pain.  Gastrointestinal: Positive for nausea and vomiting. Negative for abdominal distention.  Genitourinary: Negative for flank pain.  Musculoskeletal: Negative for back pain.  Neurological: Positive for weakness and headaches.  Hematological: Negative for adenopathy.  Psychiatric/Behavioral: Negative for confusion.     Physical Exam Updated Vital Signs BP (!) 166/103   Pulse 64   Temp 98.1 F (36.7 C) (Oral)   Resp 18   Ht 5\' 9"  (1.753 m)   Wt 180 lb (81.6 kg)   SpO2 99%   BMI 26.58 kg/m   Physical Exam  Constitutional: He is oriented to person, place, and time. He appears well-developed.  HENT:  Head: Normocephalic.  Eyes: Pupils are  equal, round, and reactive to light.  Neck: Neck supple.  Cardiovascular: Normal rate.   Pulmonary/Chest: Effort normal.  Abdominal:  Mild distention without tenderness  Musculoskeletal: He exhibits no edema or tenderness.  Neurological: He is alert and oriented to person, place, and time.  Chronic weakness in bilateral lower extremities. Can raise his legs laying down but states he cannot walk. This has been worsening.  Skin: Skin is warm. Capillary refill takes less than 2 seconds.     ED Treatments / Results  Labs (all labs ordered are listed, but only abnormal results are displayed) Labs Reviewed  CBC WITH DIFFERENTIAL/PLATELET - Abnormal; Notable for the following:       Result Value   WBC 10.7 (*)    Hemoglobin 18.3 (*)    MCV 104.6 (*)    MCH 38.5 (*)    MCHC 36.8 (*)    RDW 16.0 (*)    Neutro Abs 8.0 (*)    Monocytes Absolute 1.4 (*)    All other components within normal limits  COMPREHENSIVE METABOLIC PANEL - Abnormal; Notable for the following:    Sodium 130 (*)    Chloride 97 (*)    Glucose, Bld 103 (*)    Total Protein 4.9 (*)    Albumin 2.8 (*)    ALT 14 (*)    All other components within normal limits    EKG  EKG Interpretation None       Radiology Ct Head Wo Contrast  Result Date: 02/13/2016 CLINICAL DATA:  History of meningioma with resection and radiation. Nausea and vomiting. EXAM: CT HEAD WITHOUT CONTRAST TECHNIQUE: Contiguous axial images were obtained from the base of the skull through the vertex without intravenous contrast. COMPARISON:  CT head 02/22/2010, MRI 04/17/2007 FINDINGS: Brain: Extensive vasogenic edema in the posterior frontal and parietal lobes bilaterally, left greater than right. This has progressed since prior studies. There has been prior left parietal craniotomy for tumor resection. Based on the MRI of 04/17/2007, there was residual meningioma along the posterior falx. This is difficult to evaluate without contrast on CT, but  there appears to be hyperdensity in this area on CT consistent with tumor which may have enlarged in the interval. Further evaluation with MRI with contrast is suggested. Compression of the posterolateral ventricles due to edema. No hydrocephalus or shift of the midline structures. Negative for intracranial hemorrhage. Vascular: No hyperdense vessel or unexpected calcification. Skull: Left parietal craniotomy. Craniotomy flap in satisfactory position. Sinuses/Orbits: Mucosal edema and bony thickening of the right maxillary sinus due to chronic sinusitis. Mucosal edema in the ethmoid sinuses. Other: None IMPRESSION: Extensive vasogenic edema in the posterior frontal and parietal lobes bilaterally, left greater than right. This pattern is most consistent with recurrent meningioma. There is posterior parafalcine meningioma on prior MRI 2009. This would be  better evaluated with a follow-up MRI of brain without with contrast at this time. Electronically Signed   By: Franchot Gallo M.D.   On: 02/13/2016 11:43    Procedures Procedures (including critical care time)  Medications Ordered in ED Medications  ondansetron (ZOFRAN) injection 4 mg (not administered)  dexamethasone (DECADRON) injection 10 mg (not administered)  sodium chloride 0.9 % bolus 500 mL (0 mLs Intravenous Stopped 02/13/16 1127)  ondansetron (ZOFRAN) injection 4 mg (4 mg Intravenous Given 02/13/16 1027)     Initial Impression / Assessment and Plan / ED Course  I have reviewed the triage vital signs and the nursing notes.  Pertinent labs & imaging results that were available during my care of the patient were reviewed by me and considered in my medical decision making (see chart for details).     Patient with declining physical status. Has meningioma that may have reached the end of its treatment. Has been unable to get enough strength to be able to get to his chemotherapy treatment. Also has had nausea and vomiting. Patient's biggest goal  is to be able to be home in his bed. Does have some dehydration. Has not been able to drink. Head CT shows edema but cannot compared to the MRI at [facility]. Discussed with palate of care. Patient may be ready for hospice. Will admit to hospital and likely get MRI to evaluate progression of disease and IV fluids. May end up needing home hospice  Final Clinical Impressions(s) / ED Diagnoses   Final diagnoses:  Meningioma ([place])  Nausea without vomiting    New Prescriptions New Prescriptions   No medications on file     Davonna Belling, MD 02/13/16 1311

## 2016-02-14 ENCOUNTER — Encounter (HOSPITAL_COMMUNITY): Payer: Self-pay | Admitting: General Practice

## 2016-02-14 DIAGNOSIS — R112 Nausea with vomiting, unspecified: Secondary | ICD-10-CM | POA: Diagnosis present

## 2016-02-14 DIAGNOSIS — D329 Benign neoplasm of meninges, unspecified: Secondary | ICD-10-CM | POA: Diagnosis not present

## 2016-02-14 DIAGNOSIS — C709 Malignant neoplasm of meninges, unspecified: Secondary | ICD-10-CM | POA: Diagnosis not present

## 2016-02-14 DIAGNOSIS — Z515 Encounter for palliative care: Secondary | ICD-10-CM | POA: Diagnosis not present

## 2016-02-14 DIAGNOSIS — E785 Hyperlipidemia, unspecified: Secondary | ICD-10-CM | POA: Diagnosis present

## 2016-02-14 DIAGNOSIS — Z7189 Other specified counseling: Secondary | ICD-10-CM | POA: Diagnosis not present

## 2016-02-14 DIAGNOSIS — Z79899 Other long term (current) drug therapy: Secondary | ICD-10-CM | POA: Diagnosis not present

## 2016-02-14 DIAGNOSIS — D420 Neoplasm of uncertain behavior of cerebral meninges: Secondary | ICD-10-CM | POA: Diagnosis not present

## 2016-02-14 DIAGNOSIS — I2510 Atherosclerotic heart disease of native coronary artery without angina pectoris: Secondary | ICD-10-CM | POA: Diagnosis present

## 2016-02-14 DIAGNOSIS — Z888 Allergy status to other drugs, medicaments and biological substances status: Secondary | ICD-10-CM | POA: Diagnosis not present

## 2016-02-14 DIAGNOSIS — K5900 Constipation, unspecified: Secondary | ICD-10-CM | POA: Diagnosis present

## 2016-02-14 DIAGNOSIS — E871 Hypo-osmolality and hyponatremia: Secondary | ICD-10-CM | POA: Diagnosis present

## 2016-02-14 DIAGNOSIS — F329 Major depressive disorder, single episode, unspecified: Secondary | ICD-10-CM | POA: Diagnosis present

## 2016-02-14 DIAGNOSIS — Z7401 Bed confinement status: Secondary | ICD-10-CM | POA: Diagnosis not present

## 2016-02-14 DIAGNOSIS — F419 Anxiety disorder, unspecified: Secondary | ICD-10-CM | POA: Diagnosis present

## 2016-02-14 DIAGNOSIS — D320 Benign neoplasm of cerebral meninges: Secondary | ICD-10-CM | POA: Diagnosis present

## 2016-02-14 DIAGNOSIS — I10 Essential (primary) hypertension: Secondary | ICD-10-CM | POA: Diagnosis present

## 2016-02-14 DIAGNOSIS — G936 Cerebral edema: Secondary | ICD-10-CM | POA: Diagnosis present

## 2016-02-14 DIAGNOSIS — E860 Dehydration: Secondary | ICD-10-CM | POA: Diagnosis present

## 2016-02-14 DIAGNOSIS — Z66 Do not resuscitate: Secondary | ICD-10-CM | POA: Diagnosis present

## 2016-02-14 LAB — COMPREHENSIVE METABOLIC PANEL
ALK PHOS: 40 U/L (ref 38–126)
ALT: 14 U/L — ABNORMAL LOW (ref 17–63)
ANION GAP: 6 (ref 5–15)
AST: 17 U/L (ref 15–41)
Albumin: 2.3 g/dL — ABNORMAL LOW (ref 3.5–5.0)
BILIRUBIN TOTAL: 0.7 mg/dL (ref 0.3–1.2)
BUN: 12 mg/dL (ref 6–20)
CALCIUM: 8.1 mg/dL — AB (ref 8.9–10.3)
CO2: 23 mmol/L (ref 22–32)
Chloride: 97 mmol/L — ABNORMAL LOW (ref 101–111)
Creatinine, Ser: 1.01 mg/dL (ref 0.61–1.24)
GFR calc non Af Amer: >60 mL/min (ref >60)
Glucose, Bld: 124 mg/dL — ABNORMAL HIGH (ref 65–99)
Potassium: 4.4 mmol/L (ref 3.5–5.1)
Sodium: 126 mmol/L — ABNORMAL LOW (ref 135–145)
TOTAL PROTEIN: 4.3 g/dL — AB (ref 6.5–8.1)

## 2016-02-14 LAB — GLUCOSE, CAPILLARY: GLUCOSE-CAPILLARY: 126 mg/dL — AB (ref 65–99)

## 2016-02-14 LAB — CBC
HCT: 45.3 % (ref 39.0–52.0)
HEMOGLOBIN: 16.3 g/dL (ref 13.0–17.0)
MCH: 37.9 pg — ABNORMAL HIGH (ref 26.0–34.0)
MCHC: 36 g/dL (ref 30.0–36.0)
MCV: 105.3 fL — ABNORMAL HIGH (ref 78.0–100.0)
Platelets: 208 10*3/uL (ref 150–400)
RBC: 4.3 MIL/uL (ref 4.22–5.81)
RDW: 16 % — ABNORMAL HIGH (ref 11.5–15.5)
WBC: 9.3 10*3/uL (ref 4.0–10.5)

## 2016-02-14 MED ORDER — POLYETHYLENE GLYCOL 3350 17 G PO PACK
17.0000 g | PACK | Freq: Every day | ORAL | Status: DC
  Administered 2016-02-14 – 2016-02-15 (×2): 17 g via ORAL
  Filled 2016-02-14 (×2): qty 1

## 2016-02-14 MED ORDER — SODIUM CHLORIDE 0.9 % IV SOLN: INTRAVENOUS | Status: AC

## 2016-02-14 NOTE — Consult Note (Signed)
Consultation Note Date: 02/14/2016   Patient Name: Russell Dudley  DOB: 11-10-1945  MRN: [id number]  Age / Sex: 71 y.o., male  PCP: Denita Lung, MD Referring Physician: Caren Griffins, MD  Reason for Consultation: Establishing goals of care  HPI/Patient Profile: 71 y.o. 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 Patient was being treated at [facility], but in the past few months it has become increasingly difficult to get him to his appointments. Palliative medicine consulted for [place].    Clinical Assessment and Goals of Care: Met with patient, his spouse and their youngest daughter at bedside. Patient has been healthy most of his life and has been exercising "for a lifetime". He feels his tumor is worsening. He does not feel that chemotherapy is adding quality to his life or increasing his function. He is no longer able to stand on his own, requiring a hoyer lift at home to get him to the bathroom. His and his family's main goals for now are for him to return home and remain comfortable. Spending time with his grandchildren and his 2 dogs are very important to him. He feels that any further treatments would not be beneficial, and has been told by his oncologist at [facility] that should he decide to stop treatments of Avastin, then he would need Hospice.  We discussed palliative medicine and Hospice care. Patient and wife would like to consult with Hospice for possible choice of home with hospice and comfort care.  Symptomatically he complains of constipation. Last BM was 1/27. He is passing flatus, c/o soreness in abdomen d/t vomiting. Was able to eat dinner and breakfast this morning without nausea or vomiting.    Primary Decision Maker PATIENT    SUMMARY OF  RECOMMENDATIONS -DNR -Consult placed for Hospice at home -Miralax 17g po daily for constipation- likely combination of dehydration and dysmotility r/t decreased mobility -Recommend continuing dexamethasone in po form at d/c for symptom control of headaches and nausea    Code Status/Advance Care Planning:  DNR    Symptom Management:   As above  Palliative Prophylaxis:   Bowel Regimen  Additional Recommendations (Limitations, Scope, Preferences):  Avoid Hospitalization, Full Comfort Care and Minimize Medications  Psycho-social/Spiritual:   Desire for further Chaplaincy support:No  Additional Recommendations: Education on Hospice  Prognosis:    < 6 months d/t progressing meningioma despite chemotherapy, plan to transition to comfort measures  Discharge Planning: Home with Hospice  Primary Diagnoses: Present on Admission: . Meningioma ([place]) . Hypertension . Atypical meningioma of brain ([place])   I have reviewed the medical record, interviewed the patient and family, and examined the patient. The following aspects are pertinent.  Past Medical History:  Diagnosis Date  . Allergic rhinitis   . ASHD (arteriosclerotic heart disease)   . Cancer [facility])    brain tumor  . Dyslipidemia   . History of meningioma   . Hypertension  Social History   Social History  . Marital status: Married    Spouse name: N/A  . Number of children: N/A  . Years of education: N/A   Social History Main Topics  . Smoking status: Never Smoker  . Smokeless tobacco: Never Used  . Alcohol use 4.2 oz/week    7 Glasses of wine per week  . Drug use: No  . Sexual activity: Yes   Other Topics Concern  . Not on file   Social History Narrative  . No narrative on file   No family history on file. Scheduled Meds: . amLODipine  5 mg Oral Daily  . cloBAZam  15 mg Oral QHS  . dexamethasone  4 mg Intravenous Q6H  . hydrocortisone  15 mg Oral BID  . lisinopril  20 mg Oral Daily  .  metoprolol succinate  50 mg Oral Daily  . ondansetron  4 mg Intravenous Once  . polyethylene glycol  17 g Oral Daily  . sertraline  50 mg Oral QHS  . zonisamide  300 mg Oral QHS   Continuous Infusions: . sodium chloride 50 mL/hr at 02/13/16 2015   PRN Meds:.bisacodyl, clonazePAM, ondansetron **OR** ondansetron (ZOFRAN) IV, sodium phosphate Medications Prior to Admission:  Prior to Admission medications   Medication Sig Start Date End Date Taking? Authorizing Provider  amLODipine (NORVASC) 5 MG tablet TAKE 1 TABLET BY MOUTH  DAILY 02/07/16  Yes Denita Lung, MD  clobazam (ONFI) 10 MG tablet Take 15 mg by mouth daily.   Yes Historical Provider, MD  clonazePAM (KLONOPIN) 0.5 MG tablet Take 0.5 mg by mouth 2 (two) times daily as needed.   Yes Historical Provider, MD  dexamethasone (DECADRON) 1 MG tablet Take 1 mg by mouth daily with breakfast.   Yes Historical Provider, MD  hydrocortisone (CORTEF) 5 MG tablet Take 15 mg by mouth 2 (two) times daily.   Yes Historical Provider, MD  lisinopril (PRINIVIL,ZESTRIL) 20 MG tablet TAKE 1 TABLET BY MOUTH  DAILY 12/23/15  Yes Denita Lung, MD  metoprolol succinate (TOPROL-XL) 50 MG 24 hr tablet Take 1 tablet (50 mg total) by mouth daily. Take with or immediately following a meal. 01/21/16  Yes Denita Lung, MD  ondansetron (ZOFRAN) 8 MG tablet Take 8 mg by mouth every 8 (eight) hours as needed for nausea or vomiting.   Yes Historical Provider, MD  [facility]. Last treatment 01-21-16. Followed by Dr Encarnacion Slates:   Yes Historical Provider, MD  sertraline (ZOLOFT) 50 MG tablet Take 50 mg by mouth at bedtime.   Yes Historical Provider, MD  zonisamide (ZONEGRAN) 100 MG capsule Take 300 mg by mouth at bedtime.    Yes Historical Provider, MD   Allergies  Allergen Reactions  . Metoclopramide Hcl     Muscle spasms    Review of Systems  Constitutional: Positive for activity change and fatigue.  Respiratory: Negative.     Cardiovascular: Positive for leg swelling.  Gastrointestinal: Positive for constipation.  Genitourinary: Negative.   Neurological: Positive for weakness.  Psychiatric/Behavioral: Negative.     Physical Exam  Constitutional: He is oriented to person, place, and time. He appears well-developed and well-nourished. No distress.  HENT:  Head: Normocephalic and atraumatic.  Cardiovascular: Normal heart sounds and intact distal pulses.   Pulmonary/Chest: Effort normal and breath sounds normal.  Abdominal: Soft. Bowel sounds are normal. He exhibits no mass.  Musculoskeletal: Normal range of motion.  Neurological: He is alert and oriented to  person, place, and time.  Skin: Skin is warm and dry.  Psychiatric: He has a normal mood and affect. His behavior is normal. Judgment and thought content normal.    Vital Signs: BP (!) 144/79 (BP Location: Right Arm)   Pulse 61   Temp 97.5 F (36.4 C) (Oral)   Resp 18   Ht 5' 9"  (1.753 m)   Wt 81.6 kg (180 lb)   SpO2 97%   BMI 26.58 kg/m  Pain Assessment: No/denies pain       SpO2: SpO2: 97 % O2 Device:SpO2: 97 % O2 Flow Rate: .   IO: Intake/output summary:  Intake/Output Summary (Last 24 hours) at 02/14/16 1238 Last data filed at 02/14/16 0957  Gross per 24 hour  Intake             1090 ml  Output             1800 ml  Net             -710 ml    LBM: Last BM Date: 02/11/16 Baseline Weight: Weight: 81.6 kg (180 lb) Most recent weight: Weight: 81.6 kg (180 lb)     Palliative Assessment/Data: PPS: 30%   Flowsheet Rows   Flowsheet Row Most Recent Value  Intake Tab  Referral Department  -- [ED]  Unit at Time of Referral  ER  Palliative Care Primary Diagnosis  Cancer  Date Notified  02/13/16  Palliative Care Type  New Palliative care  Reason for referral  Clarify Goals of Care  Date of Admission  02/13/16  # of days IP prior to Palliative referral  0  Clinical Assessment  Psychosocial & Spiritual Assessment  Palliative Care  Outcomes      Thank you for this consult. Palliative medicine will continue to follow and assist as needed.   Time Total: 90 minutes Greater than 50%  of this time was spent counseling and coordinating care related to the above assessment and plan.  Signed by: Mariana Kaufman, AGNP-C Palliative Medicine    Please contact Palliative Medicine Team phone at [phone] for questions and concerns.  For individual provider: See Shea Evans

## 2016-02-14 NOTE — Clinical Social Work Note (Signed)
CSW consulted for an out of facility DNR form. CSW spoke with MD, who confirmed pt is a DNR. CSW placed an out of facility DNR form on pt's shadow chart for MD signature. RNCM is following for hospice services at home. CSW is signing off as no further needs identified. Please reconsult if a need arises prior to discharge.   Darden Dates, MSW, LCSW  Clinical Social Worker  [phone]

## 2016-02-14 NOTE — Progress Notes (Signed)
[facility] Liaison Note:  Notified by Nira Conn [facility] of patient/family request for Hospice and [place] services at home after discharge.    Russell Ponto, RN and I met with patient and wife, Russell Dudley.  Patient expressed his interest in Hospice services but states "I know I'm gonna have to make the decision to go to Hospice eventually, but not right now".     Patient/wife were offered emotional support and hospice philosophy, services and team approach to care were explained.  They verbalized understanding of the same.   Patient and wife want to further discuss Hospice services and make decision as to when they would like to pursue.  At this time, patient wants to continue one of the chemotherapy treatments and possible work with [facility] on further treatments.    I gave them brochures on Hospice services and numbers to contact when they are interested in our services.    Discussed with Nira Conn, CMRN.  She advised that she would follow up with patient tomorrow morning and let us know of any changes.  Thank you for the referral.  Edyth Gunnels, RN, [facility] Liaison [phone]  All hospital liaison's are now on [place].  Please feel free to contact me at the above number or call hospice at [phone] after 5pm.

## 2016-02-14 NOTE — Evaluation (Signed)
Physical Therapy Evaluation Patient Details Name: Russell Dudley MRN: [id number] DOB: 1945-02-19 Today's Date: 02/14/2016   History of Present Illness  Russell Dudley is a 71 y.o. male with history of hypertension, dyslipidemia, Recurrent atypical meningioma, status post resections 2 in the past, gamma- knife radiation and currently he was getting Avastin chemotherapy every other week at [facility], initially started March 2016, presents to ER for severed intractable n/v  Clinical Impression  Pt admitted with above diagnosis. Pt currently with functional limitations due to the deficits listed below (see PT Problem List). Pt was able to sit EOB but needed max assist due to posterior lean with pt having difficulty with motor planning needing constant physical assist.  Used Clarise Cruz plus for pt to stand x 2 with pt doing very well with +2 mod assist with ability to stand x 2 with machine.  Wife uses hoyer at home and is comfortable with this.  Will follow acutely.  Pt will benefit from skilled PT to increase their independence and safety with mobility to allow discharge to the venue listed below.      Follow Up Recommendations Home health PT;Supervision/Assistance - 24 hour (If pt decides against Hospice, will benefit from HHPT)    Equipment Recommendations  Other (comment) (Air mattress overlay)    Recommendations for Other Services       Precautions / Restrictions Precautions Precautions: Fall Restrictions Weight Bearing Restrictions: No      Mobility  Bed Mobility Overal bed mobility: Needs Assistance Bed Mobility: Supine to Sit     Supine to sit: Max assist;+2 for physical assistance;HOB elevated     General bed mobility comments: Pt needed max assist for LEs and elvation of trunk as pt cannot motor plan the movements to coordinate coming to EOB.   Transfers Overall transfer level: Needs assistance Equipment used: Ambulation equipment used Transfers: Sit to/from Stand Sit to Stand:  Min assist;Mod assist;+2 physical assistance;From elevated surface         General transfer comment: Pt stood with Clarise Cruz plus for 5 min and then 3 1/2 min.  Pt was able to weight shift bil directions with notable right knee hyperextension.  Pt was able to stand fully upright with cues however did not maintain jnless constantly cued.    Ambulation/Gait             General Gait Details: unable  Stairs            Wheelchair Mobility    Modified Rankin (Stroke Patients Only)        Balance Overall balance assessment: Needs assistance;History of Falls Sitting-balance support: Bilateral upper extremity supported;Feet supported Sitting balance-Leahy Scale: Zero Sitting balance - Comments: Pt has a lot of difficulty using his UEs as pt leans posteriorly significantly.  Pt cannot come forward and maintain even with max tactile and verbal cues and needs physical max assist to sit EOB.  Postural control: Posterior lean                                   Pertinent Vitals/Pain Pain Assessment: 0-10 Pain Score: 7  Pain Location: back and LEs Pain Descriptors / Indicators: Aching;Grimacing;Guarding;Sore Pain Intervention(s): Limited activity within patient's tolerance;Monitored during session;Premedicated before session;Repositioned  VSS    Home Living Family/patient expects to be discharged to:: Private residence Living Arrangements: Spouse/significant other Available Help at Discharge: Family;Available 24 hours/day Type of Home: House Home Access: Ramped  entrance     Home Layout: One level Home Equipment: Hospital bed;Wheelchair - manual (stair lift, hoyer lift, transport commode on wheels)      Prior Function Level of Independence: Needs assistance   Gait / Transfers Assistance Needed: Wife had been hoyer lifting pt PTA, would sit EOB with PT  ADL's / Homemaking Assistance Needed: Total assist, used the rolling commode to use the bathroom, sponge bathed  as rollling coommode does not fit in bathroom.        Hand Dominance        Extremity/Trunk Assessment   Upper Extremity Assessment Upper Extremity Assessment: Defer to OT evaluation    Lower Extremity Assessment Lower Extremity Assessment: RLE deficits/detail;LLE deficits/detail RLE Deficits / Details: grossly 3-/5 LLE Deficits / Details: grossly 2/5    Cervical / Trunk Assessment Cervical / Trunk Assessment: Kyphotic  Communication   Communication: Expressive difficulties  Cognition Arousal/Alertness: Awake/alert Behavior During Therapy: Anxious Overall Cognitive Status: History of cognitive impairments - at baseline                      General Comments      Exercises     Assessment/Plan    PT Assessment Patient needs continued PT services  PT Problem List Decreased activity tolerance;Decreased balance;Decreased mobility;Decreased strength;Decreased range of motion;Decreased knowledge of use of DME;Decreased safety awareness;Decreased knowledge of precautions;Pain          PT Treatment Interventions DME instruction;Gait training;Functional mobility training;Therapeutic activities;Therapeutic exercise;Balance training;Patient/family education    PT Goals (Current goals can be found in the Care Plan section)  Acute Rehab PT Goals Patient Stated Goal: to go home PT Goal Formulation: With patient Time For Goal Achievement: 02/28/16 Potential to Achieve Goals: Good    Frequency Min 3X/week   Barriers to discharge        Co-evaluation               End of Session Equipment Utilized During Treatment: Gait belt Activity Tolerance: Patient limited by fatigue;Patient limited by pain Patient left: with call bell/phone within reach;in bed;with family/visitor present Nurse Communication: Mobility status;Need for lift equipment    Functional Assessment Tool Used: clinical judgment Functional Limitation: Changing and maintaining body  position Changing and Maintaining Body Position Current Status [phone]): At least 40 percent but less than 60 percent impaired, limited or restricted Changing and Maintaining Body Position Goal Status [id number]): At least 20 percent but less than 40 percent impaired, limited or restricted    Time: [id number] PT Time Calculation (min) (ACUTE ONLY): 38 min   Charges:   PT Evaluation $PT Eval Moderate Complexity: 1 Procedure PT Treatments $Therapeutic Activity: 8-22 mins $Self Care/Home Management: 8-22   PT G Codes:   PT G-Codes **NOT FOR INPATIENT CLASS** Functional Assessment Tool Used: clinical judgment Functional Limitation: Changing and maintaining body position Changing and Maintaining Body Position Current Status [id number]): At least 40 percent but less than 60 percent impaired, limited or restricted Changing and Maintaining Body Position Goal Status [id number]): At least 20 percent but less than 40 percent impaired, limited or restricted    Denice Paradise 02/14/2016, 4:54 PM [facility] Acute Rehabilitation [phone] (pager)

## 2016-02-14 NOTE — Progress Notes (Signed)
PROGRESS NOTE  Russell Dudley [id number] DOB: May 07, 1945 DOA: 02/13/2016 PCP: Wyatt Haste, MD   LOS: 0 days   Brief Narrative: Russell Dudley is a 71 y.o. male with history of hypertension, dyslipidemia, Recurrent atypical meningioma, status post resections 2 in the past, gamma- knife radiation and currently he was getting Avastin chemotherapy every other week at [facility], initially started March 2016, presents to ER for severed intractable n/v  Assessment & Plan: Principal Problem:   Atypical meningioma of brain ([place]) Active Problems:   Hypertension   Meningioma ([place])   N&V (nausea and vomiting)   Debility   Palliative care by specialist   Goals of care, counseling/discussion   Intractable nausea and vomiting - Resolved this morning, likely in the setting of worsening intracranial findings. Continue Decadron.  Recurrent progressive meningioma - MRI done on admission with advancing disease and severe vasogenic edema. Started on IV Decadron, improving, continue for now. PT consult placed.  - d/w with NP from brain center  HTN - continue Norvasc< Metoprolol  Hyponatremia  - due to intracranial process, monitor Na, slightly on the dry side. Continue fluids   DVT prophylaxis: SCD Code Status: DNR Family Communication: wife bedside Disposition Plan: home 1 day  Consultants:  Palliative  Procedures:   None   Antimicrobials:  None    Subjective: - no chest pain, shortness of breath, no abdominal pain, nausea or vomiting.   Objective: Vitals:   02/13/16 1523 02/13/16 2159 02/14/16 0450 02/14/16 1358  BP: (!) 186/89 (!) 150/79 (!) 144/79 (!) 154/87  Pulse: 64 64 61 75  Resp:  18 18 18   Temp: 97.7 F (36.5 C) 98 F (36.7 C) 97.5 F (36.4 C) 97.7 F (36.5 C)  TempSrc: Oral Oral Oral Oral  SpO2: 98% 95% 97% 98%  Weight:      Height:        Intake/Output Summary (Last 24 hours) at 02/14/16 1415 Last data filed at 02/14/16 1358  Gross per 24 hour    Intake             1570 ml  Output             1800 ml  Net             -230 ml   Filed Weights   02/13/16 0950  Weight: 81.6 kg (180 lb)    Examination: Constitutional: NAD Vitals:   02/13/16 1523 02/13/16 2159 02/14/16 0450 02/14/16 1358  BP: (!) 186/89 (!) 150/79 (!) 144/79 (!) 154/87  Pulse: 64 64 61 75  Resp:  18 18 18   Temp: 97.7 F (36.5 C) 98 F (36.7 C) 97.5 F (36.4 C) 97.7 F (36.5 C)  TempSrc: Oral Oral Oral Oral  SpO2: 98% 95% 97% 98%  Weight:      Height:       Eyes: PERRL, lids and conjunctivae normal Respiratory: clear to auscultation bilaterally, no wheezing, no crackles. Cardiovascular: Regular rate and rhythm, no murmurs / rubs / gallops. Abdomen: no tenderness. Bowel sounds positive.  Musculoskeletal: no clubbing / cyanosis. Neurologic: 5/5 strength, poor coordination.   Data Reviewed: I have personally reviewed following labs and imaging studies  CBC:  Recent Labs Lab 02/13/16 1032 02/14/16 0621  WBC 10.7* 9.3  NEUTROABS 8.0*  --   HGB 18.3* 16.3  HCT 49.7 45.3  MCV 104.6* 105.3*  PLT 207 [id number]   Basic Metabolic Panel:  Recent Labs Lab 02/13/16 1032 02/14/16 0621  NA 130* 126*  K  4.2 4.4  CL 97* 97*  CO2 23 23  GLUCOSE 103* 124*  BUN 6 12  CREATININE 0.81 1.01  CALCIUM 8.9 8.1*   GFR: Estimated Creatinine Clearance: 70 mL/min (by C-G formula based on SCr of 1.01 mg/dL). Liver Function Tests:  Recent Labs Lab 02/13/16 1032 02/14/16 0621  AST 20 17  ALT 14* 14*  ALKPHOS 43 40  BILITOT 0.8 0.7  PROT 4.9* 4.3*  ALBUMIN 2.8* 2.3*   No results for input(s): LIPASE, AMYLASE in the last 168 hours. No results for input(s): AMMONIA in the last 168 hours. Coagulation Profile: No results for input(s): INR, PROTIME in the last 168 hours. Cardiac Enzymes: No results for input(s): CKTOTAL, CKMB, CKMBINDEX, TROPONINI in the last 168 hours. BNP (last 3 results) No results for input(s): PROBNP in the last 8760 hours. HbA1C: No  results for input(s): HGBA1C in the last 72 hours. CBG:  Recent Labs Lab 02/14/16 0801  GLUCAP 126*   Lipid Profile: No results for input(s): CHOL, HDL, LDLCALC, TRIG, CHOLHDL, LDLDIRECT in the last 72 hours. Thyroid Function Tests: No results for input(s): TSH, T4TOTAL, FREET4, T3FREE, THYROIDAB in the last 72 hours. Anemia Panel: No results for input(s): VITAMINB12, FOLATE, FERRITIN, TIBC, IRON, RETICCTPCT in the last 72 hours. Urine analysis:    Component Value Date/Time   COLORURINE STRAW (A) 12/18/2009 1952   APPEARANCEUR CLEAR 12/18/2009 1952   LABSPEC 1.010 12/18/2009 1952   PHURINE 7.5 12/18/2009 1952   GLUCOSEU NEGATIVE 12/18/2009 1952   HGBUR NEGATIVE 12/18/2009 1952   BILIRUBINUR N 05/18/2011 0845   KETONESUR NEGATIVE 12/18/2009 1952   PROTEINUR N 05/18/2011 0845   PROTEINUR NEGATIVE 12/18/2009 1952   UROBILINOGEN negative 05/18/2011 0845   UROBILINOGEN 0.2 12/18/2009 1952   NITRITE N 05/18/2011 0845   NITRITE NEGATIVE 12/18/2009 1952   LEUKOCYTESUR Negative 05/18/2011 0845   Sepsis Labs: Invalid input(s): PROCALCITONIN, LACTICIDVEN  No results found for this or any previous visit (from the past 240 hour(s)).    Radiology Studies: Ct Head Wo Contrast  Result Date: 02/13/2016 CLINICAL DATA:  History of meningioma with resection and radiation. Nausea and vomiting. EXAM: CT HEAD WITHOUT CONTRAST TECHNIQUE: Contiguous axial images were obtained from the base of the skull through the vertex without intravenous contrast. COMPARISON:  CT head 02/22/2010, MRI 04/17/2007 FINDINGS: Brain: Extensive vasogenic edema in the posterior frontal and parietal lobes bilaterally, left greater than right. This has progressed since prior studies. There has been prior left parietal craniotomy for tumor resection. Based on the MRI of 04/17/2007, there was residual meningioma along the posterior falx. This is difficult to evaluate without contrast on CT, but there appears to be  hyperdensity in this area on CT consistent with tumor which may have enlarged in the interval. Further evaluation with MRI with contrast is suggested. Compression of the posterolateral ventricles due to edema. No hydrocephalus or shift of the midline structures. Negative for intracranial hemorrhage. Vascular: No hyperdense vessel or unexpected calcification. Skull: Left parietal craniotomy. Craniotomy flap in satisfactory position. Sinuses/Orbits: Mucosal edema and bony thickening of the right maxillary sinus due to chronic sinusitis. Mucosal edema in the ethmoid sinuses. Other: None IMPRESSION: Extensive vasogenic edema in the posterior frontal and parietal lobes bilaterally, left greater than right. This pattern is most consistent with recurrent meningioma. There is posterior parafalcine meningioma on prior MRI 2009. This would be better evaluated with a follow-up MRI of brain without with contrast at this time. Electronically Signed   By: Franchot Gallo M.D.  On: 02/13/2016 11:43   Mr Jeri Cos [id number] Contrast  Result Date: 02/13/2016 CLINICAL DATA:  71 y/o M; history recurrent atypical meningioma with 2 prior resections, gamma knife radiation, and currently on chemotherapy presenting with severe intractable nausea and vomiting. EXAM: MRI HEAD WITHOUT AND WITH CONTRAST TECHNIQUE: Multiplanar, multiecho pulse sequences of the brain and surrounding structures were obtained without and with intravenous contrast. CONTRAST:  27mL MULTIHANCE GADOBENATE DIMEGLUMINE 529 MG/ML IV SOLN COMPARISON:  02/13/2016 CT of the head. 04/17/2007 MRI of the brain. FINDINGS: Brain: Irregular enhancing plaque-like mass, consistent with history of atypical meningioma, centered along the posterior falx in the occipital, parietal, and posterior frontal regions within ill-defined margin with the adjacent brain throughout indicating probable brain parenchymal invasion. 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 In conglomerate the mass covers an area measuring 7.3 x 3.7 x 8.0 cm (AP x ML x CC) series 12, image 31 and series 14, image 15. Susceptibility blooming within the mass probably represents mineralization. There is diffuse T2 FLAIR hyperintense signal abnormality extending throughout the bilateral occipital, parietal, posterior frontal, and posterior temporal lobes as well as the bilateral insula greater on the left consistent with vasogenic edema. There is mild mass effect with sulcal effacement as well as partial effacement of the occipital horns of lateral ventricles. No downward herniation. No diffusion restriction of the brain to suggest acute or early subacute infarct. No evidence for acute hemorrhage. Vascular: Normal flow voids. Skull and upper cervical spine: Postsurgical changes related to posterior paramedian parietal and occipital craniotomies. Sinuses/Orbits: Moderate mucosal thickening of the right maxillary sinus and mild mucosal thickening of ethmoid air cells. Otherwise no abnormal signal of paranasal sinuses or mastoid air cells. Other: None. IMPRESSION: 1. Irregular enhancing plaque-like mass, consistent with history of atypical meningioma, centered along the posterior falx in the occipital, parietal, and posterior frontal regions. This is increased in size and severely from 2009. Comparison with recent outside imaging were available can assess interval change. 2. Ill-defined margin of the mass to the brain indicating probable brain parenchymal invasion 3. 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 4. Severe diffuse vasogenic edema throughout the left greater than right posterior brain. 5. Mass effect with sulcal effacement and lateral ventricle  occipital horn effacement. No herniation. 6. No evidence of acute/early subacute infarct or acute hemorrhage in the brain. Electronically Signed   By: Kristine Garbe M.D.   On: 02/13/2016 20:26     Scheduled Meds: . amLODipine  5 mg Oral Daily  . cloBAZam  15 mg Oral QHS  . dexamethasone  4 mg Intravenous Q6H  . hydrocortisone  15 mg Oral BID  . lisinopril  20 mg Oral Daily  . metoprolol succinate  50 mg Oral Daily  . ondansetron  4 mg Intravenous Once  . polyethylene glycol  17 g Oral Daily  . sertraline  50 mg Oral QHS  . zonisamide  300 mg Oral QHS   Continuous Infusions: . sodium chloride 50 mL/hr at 02/14/16 [place], MD, PhD [facility] Pager [phone]  If 7PM-7AM, please contact night-coverage [email or web address] Password [email or web address] 02/14/2016, 2:15 PM

## 2016-02-14 NOTE — Care Management Note (Signed)
Case Management Note  Patient Details  Name: Russell Dudley MRN: [id number] Date of Birth: [birth year]  Subjective/Objective:                    Action/Plan: Confirmed face sheet information with patient , patient's wife and daughter at bedside.   Home phone is [phone] , patient's wife Vaughan Basta cell is [phone].   Plan to discharge to home tomorrow with hospice.   Patient already has hospital bed at home , however mattress is uncomfortable and he is wondering if he can have a different mattress. Will ask hospice. Hospital bed was through [facility]. Patient has hoyer lift, two wheelchairs , chair lift and wheelchair commode at home.   Patient has 1 step to enter home, he has metal ramp.  PCP is Dr Jill Alexanders . Oncologist at [facility] is Dr Lorri Frederick.  Patient will need non emergent ambulance transportation home at discharge.   Referral called in to Hospice and [place]. Expected Discharge Date:                  Expected Discharge Plan:  Home w Hospice Care  In-House Referral:     Discharge planning Services  CM Consult  Post Acute Care Choice:    Choice offered to:  Patient, Spouse  DME Arranged:    DME Agency:     HH Arranged:    [place] Agency:  [facility]  Status of Service:  In process, will continue to follow  If discussed at Long Length of Stay Meetings, dates discussed:    Additional Comments:  Marilu Favre, RN 02/14/2016, 2:40 PM

## 2016-02-15 DIAGNOSIS — D329 Benign neoplasm of meninges, unspecified: Secondary | ICD-10-CM

## 2016-02-15 LAB — BASIC METABOLIC PANEL
ANION GAP: 9 (ref 5–15)
BUN: 12 mg/dL (ref 6–20)
CHLORIDE: 96 mmol/L — AB (ref 101–111)
CO2: 22 mmol/L (ref 22–32)
Calcium: 8.1 mg/dL — ABNORMAL LOW (ref 8.9–10.3)
Creatinine, Ser: 0.91 mg/dL (ref 0.61–1.24)
GFR calc Af Amer: >60 mL/min (ref >60)
GLUCOSE: 151 mg/dL — AB (ref 65–99)
POTASSIUM: 4.2 mmol/L (ref 3.5–5.1)
Sodium: 127 mmol/L — ABNORMAL LOW (ref 135–145)

## 2016-02-15 MED ORDER — DEXAMETHASONE 4 MG PO TABS: 4.0000 mg | ORAL_TABLET | Freq: Two times a day (BID) | ORAL | 0 refills | Status: AC

## 2016-02-15 NOTE — Care Management (Addendum)
  1138 At present Patient and wife are going to go home with home health through [facility] . Called Drew with [facility] . When patient ready to be transported home , will call PTAR.   Magdalen Spatz RN BSN [phone]     Spoke with patient and his wife at bedside. They called [facility] yesterday and waiting for call back today. Then they will decide on home with hospice or home with home health. Prior to admission patient was active with [facility] .   Drew with [facility] aware of above. If patient discharges to home with home health , [facility] will need orders and face to face for HHPT/OT/RN/SW.   Patient has NCM direct cell number . Will continue to follow.   NCM will arrange ambulance transportation home at discharge.  Magdalen Spatz RN BSN [phone]

## 2016-02-15 NOTE — Progress Notes (Signed)
Physical Therapy Treatment Patient Details Name: Russell Dudley MRN: [id number] DOB: [birth year] Today's Date: 02/15/2016    History of Present Illness Russell Dudley is a 71 y.o. male with history of hypertension, dyslipidemia, Recurrent atypical meningioma, status post resections 2 in the past, gamma- knife radiation and currently he was getting Avastin chemotherapy every other week at [facility], initially started March 2016, presents to ER for severed intractable n/v    PT Comments    Pt admitted with above diagnosis. Pt currently with functional limitations due to balance and endurance deficits. Pt was able to stand longer with Clarise Cruz plus today.  This piece of equipment works very well for pt and his postural stability in sitting was better as well. Used Clarise Cruz plus to get pt to chair to improve chair tolerance.  Pt will benefit from skilled PT to increase their independence and safety with mobility to allow discharge to the venue listed below.    Follow Up Recommendations  Home health PT;Supervision/Assistance - 24 hour (If pt decides against Hospice, will benefit from HHPT)     Equipment Recommendations  Other (comment) (Air mattress overlay)    Recommendations for Other Services       Precautions / Restrictions Precautions Precautions: Fall Restrictions Weight Bearing Restrictions: No    Mobility  Bed Mobility Overal bed mobility: Needs Assistance Bed Mobility: Supine to Sit     Supine to sit: Max assist;+2 for physical assistance;HOB elevated     General bed mobility comments: Pt needed mod assist for LEs and elevation of trunk as pt cannot motor plan the movements to coordinate coming to EOB. Did a little better needing slightly less assist today.   Transfers Overall transfer level: Needs assistance Equipment used: Ambulation equipment used Transfers: Sit to/from Stand Sit to Stand: Min assist;Mod assist;+2 physical assistance;From elevated surface         General  transfer comment: Pt stood with Clarise Cruz plus for 10 min and then 2 and 1/2 min.  Pt was able to weight shift bil directions with notable right knee hyperextension.  Pt was able to stand fully upright with cues and could maintain with belt loosened.  Assisted pt to chair via Clarise Cruz plus.   Pt does very well with this piece of equipment.   Ambulation/Gait             General Gait Details: unable   Stairs            Wheelchair Mobility    Modified Rankin (Stroke Patients Only)       Balance Overall balance assessment: Needs assistance;History of Falls Sitting-balance support: Bilateral upper extremity supported;Feet supported Sitting balance-Leahy Scale: Poor Sitting balance - Comments: Pt has a lot of difficulty using his UEs as pt leans posteriorly significantly.  Pt was able to  come forward and maintain forward sitting today.   Postural control: Posterior lean                          Cognition Arousal/Alertness: Awake/alert Behavior During Therapy: Anxious Overall Cognitive Status: History of cognitive impairments - at baseline                      Exercises      General Comments        Pertinent Vitals/Pain Pain Assessment: 0-10 Pain Score: 4  Pain Location: back and LEs Pain Descriptors / Indicators: Aching;Grimacing;Guarding;Sore Pain Intervention(s): Limited activity within patient's tolerance;Monitored during  session;Repositioned    Home Living                      Prior Function            PT Goals (current goals can now be found in the care plan section) Acute Rehab PT Goals Patient Stated Goal: to go home Progress towards PT goals: Progressing toward goals    Frequency    Min 3X/week      PT Plan Current plan remains appropriate    Co-evaluation             End of Session Equipment Utilized During Treatment: Gait belt Activity Tolerance: Patient limited by fatigue;Patient limited by pain Patient left:  with call bell/phone within reach;in bed;with family/visitor present     Time: 1030-1056 PT Time Calculation (min) (ACUTE ONLY): 26 min  Charges:  $Therapeutic Activity: 23-37 mins                    G Codes:      Denice Paradise 03-06-2016, 12:08 PM Fitzpatrick Alberico,PT Acute Rehabilitation [phone] (pager)

## 2016-02-15 NOTE — Clinical Social Work Note (Signed)
CSW reconsulted for possible Hospice patient. CSW spoke with [facility] and MD. Pt wil either discharge home with home heatlh services or Hospice services. RNCM is following for discharge planning needs. RNCM will also arrange [facility] for transportation home. CSW is signing off as no further needs identified.   Darden Dates, MSW, LCSW Clinical Socail Worker  [phone]

## 2016-02-15 NOTE — Care Management (Signed)
Nurse stated patient ready for transport home. PT[facility] called.   Magdalen Spatz RN BSN [phone]

## 2016-02-15 NOTE — Discharge Summary (Signed)
Physician Discharge Summary  DESAI APA [id number] DOB: 1945/06/07 DOA: 02/13/2016  PCP: Wyatt Haste, MD  Admit date: 02/13/2016 Discharge date: 02/15/2016  Admitted From: Home Disposition:  Home  Recommendations for Outpatient Follow-up:  1. Follow up with PCP in 1-2 weeks. 2. Please obtain BMP in one week to monitor sodium levels 3. Follow up with [facility].   Home Health: PT/OT/RN/SW Equipment/Devices: Hoyer lift at home.  Discharge Condition: Stable CODE STATUS: DNR Diet recommendation: regular    HPI: Per Dr. Janne Napoleon, Russell Dudley is a 71 y.o. male with history of hypertension, dyslipidemia, Recurrent atypical meningioma, status post resections 2 in the past, gamma- knife radiation and currently he was getting Avastin chemotherapy every other week at [facility], initially started March 2016, presents to ER for severed intractable n/v since last night.  Of note, every time he tried to drink or eat, he would have emesis.  His last chemorx session was on 01/21/2016. He was unable to get his second session in January due to the weather/snow we recently experienced.  Life long nonsmoker.  Of note, he has had extensive treatment for his meningiomas which has caused him to have right lower extremity proprioception.  He currently is on decadron 1mg  qam and cortef 15mg  po bid. Of note, the last few times that patient and wife has gone to [facility], they have had a difficult very difficult time transferring him because he is unable to ambulate on his own.  He is currently fairly bedbound now at home.  Wife has to use a Holter to help transfer him.  No f/c/cough/sick contacts/recent travels.  Hospital Course: Discharge Diagnoses:  Principal Problem:   Atypical meningioma of brain ([place]) Active Problems:   Hypertension   Meningioma (HCC)   Meningioma, malignant (HCC)   N&V (nausea and vomiting)   Debility   Palliative care by specialist   Goals of care,  counseling/discussion  Intractable nausea and vomiting in the setting of recurrent worsening meningioma - patient admitted to the hospital with intractable nausea / vomiting as well as progressive decline in his activities. He underwent a brain MRI which showed worsening disease with severe vasogenic edema. He was placed on IV decadron and responded well, with resolution of his nausea/vomiting and improvement in his motor abilities. His decadron was converted to po and patient was discharged home in stable condition. Patient's wife expressed concerns with transportation from home to [place] center, I have contacted the NP who works with Dr. Ferdinand Lango who will in turn reach out to their SW to assist patient with transportation. Palliative was consulted as well during this hospitalization. If he is not to go to [facility] anymore for Avastin infusions then he will set up for home hospice. [place] SW also consulted.  Hypertension - Continue Metoprolol Hyponatremia - due to intracranial process. Na stable, chronically low ~128 range.   Discharge Instructions   Allergies as of 02/15/2016      Reactions   Metoclopramide Hcl    Muscle spasms       Medication List    TAKE these medications   amLODipine 5 MG tablet Commonly known as:  NORVASC TAKE 1 TABLET BY MOUTH  DAILY   clonazePAM 0.5 MG tablet Commonly known as:  KLONOPIN Take 0.5 mg by mouth 2 (two) times daily as needed.   dexamethasone 4 MG tablet Commonly known as:  DECADRON Take 1 tablet (4 mg total) by mouth 2 (two) times daily with a meal. X 7 days  then 4 mg daily x 7 days then return to prior dose What changed:  medication strength  how much to take  when to take this  additional instructions   hydrocortisone 5 MG tablet Commonly known as:  CORTEF Take 15 mg by mouth 2 (two) times daily.   lisinopril 20 MG tablet Commonly known as:  PRINIVIL,ZESTRIL TAKE 1 TABLET BY MOUTH  DAILY   metoprolol succinate 50 MG 24 hr  tablet Commonly known as:  TOPROL-XL Take 1 tablet (50 mg total) by mouth daily. Take with or immediately following a meal.   ondansetron 8 MG tablet Commonly known as:  ZOFRAN Take 8 mg by mouth every 8 (eight) hours as needed for nausea or vomiting.   ONFI 10 MG tablet Generic drug:  cloBAZam Take 15 mg by mouth daily.   PRESCRIPTION MEDICATION [facility]. Last treatment 01-21-16. Followed by Dr Encarnacion Slates:   sertraline 50 MG tablet Commonly known as:  ZOLOFT Take 50 mg by mouth at bedtime.   zonisamide 100 MG capsule Commonly known as:  ZONEGRAN Take 300 mg by mouth at bedtime.       Allergies  Allergen Reactions  . Metoclopramide Hcl     Muscle spasms     Consultations:  Palliative  Procedures/Studies: Ct Head Wo Contrast  Result Date: 02/13/2016 CLINICAL DATA:  History of meningioma with resection and radiation. Nausea and vomiting. EXAM: CT HEAD WITHOUT CONTRAST TECHNIQUE: Contiguous axial images were obtained from the base of the skull through the vertex without intravenous contrast. COMPARISON:  CT head 02/22/2010, MRI 04/17/2007 FINDINGS: Brain: Extensive vasogenic edema in the posterior frontal and parietal lobes bilaterally, left greater than right. This has progressed since prior studies. There has been prior left parietal craniotomy for tumor resection. Based on the MRI of 04/17/2007, there was residual meningioma along the posterior falx. This is difficult to evaluate without contrast on CT, but there appears to be hyperdensity in this area on CT consistent with tumor which may have enlarged in the interval. Further evaluation with MRI with contrast is suggested. Compression of the posterolateral ventricles due to edema. No hydrocephalus or shift of the midline structures. Negative for intracranial hemorrhage. Vascular: No hyperdense vessel or unexpected calcification. Skull: Left parietal craniotomy. Craniotomy flap in satisfactory position.  Sinuses/Orbits: Mucosal edema and bony thickening of the right maxillary sinus due to chronic sinusitis. Mucosal edema in the ethmoid sinuses. Other: None IMPRESSION: Extensive vasogenic edema in the posterior frontal and parietal lobes bilaterally, left greater than right. This pattern is most consistent with recurrent meningioma. There is posterior parafalcine meningioma on prior MRI 2009. This would be better evaluated with a follow-up MRI of brain without with contrast at this time. Electronically Signed   By: Franchot Gallo M.D.   On: 02/13/2016 11:43   Mr Jeri Cos [id number] Contrast  Result Date: 02/13/2016 CLINICAL DATA:  71 y/o M; history recurrent atypical meningioma with 2 prior resections, gamma knife radiation, and currently on chemotherapy presenting with severe intractable nausea and vomiting. EXAM: MRI HEAD WITHOUT AND WITH CONTRAST TECHNIQUE: Multiplanar, multiecho pulse sequences of the brain and surrounding structures were obtained without and with intravenous contrast. CONTRAST:  30mL MULTIHANCE GADOBENATE DIMEGLUMINE 529 MG/ML IV SOLN COMPARISON:  02/13/2016 CT of the head. 04/17/2007 MRI of the brain. FINDINGS: Brain: Irregular enhancing plaque-like mass, consistent with history of atypical meningioma, centered along the posterior falx in the occipital, parietal, and posterior frontal regions within ill-defined margin with the adjacent brain throughout  indicating probable brain parenchymal invasion. 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 In conglomerate the mass covers an area measuring 7.3 x 3.7 x 8.0 cm (AP x ML x CC) series 12, image 31 and series 14, image 15. Susceptibility blooming within the mass probably represents mineralization. There is diffuse T2 FLAIR hyperintense signal abnormality extending throughout the  bilateral occipital, parietal, posterior frontal, and posterior temporal lobes as well as the bilateral insula greater on the left consistent with vasogenic edema. There is mild mass effect with sulcal effacement as well as partial effacement of the occipital horns of lateral ventricles. No downward herniation. No diffusion restriction of the brain to suggest acute or early subacute infarct. No evidence for acute hemorrhage. Vascular: Normal flow voids. Skull and upper cervical spine: Postsurgical changes related to posterior paramedian parietal and occipital craniotomies. Sinuses/Orbits: Moderate mucosal thickening of the right maxillary sinus and mild mucosal thickening of ethmoid air cells. Otherwise no abnormal signal of paranasal sinuses or mastoid air cells. Other: None. IMPRESSION: 1. Irregular enhancing plaque-like mass, consistent with history of atypical meningioma, centered along the posterior falx in the occipital, parietal, and posterior frontal regions. This is increased in size and severely from 2009. Comparison with recent outside imaging were available can assess interval change. 2. Ill-defined margin of the mass to the brain indicating probable brain parenchymal invasion 3. 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 4. Severe diffuse vasogenic edema throughout the left greater than right posterior brain. 5. Mass effect with sulcal effacement and lateral ventricle occipital horn effacement. No herniation. 6. No evidence of acute/early subacute infarct or acute hemorrhage in the brain. Electronically Signed   By: Kristine Garbe M.D.   On: 02/13/2016 20:26      Subjective: Patient reports to be feeling better and is eager to go home. He understands his overall prognosis and is frustrated with his state of health.    Discharge Exam: Vitals:   02/14/16 2120 02/15/16 0414   BP: (!) 153/85 (!) 158/86  Pulse: 73 68  Resp: 18 18  Temp: 98 F (36.7 C) 98 F (36.7 C)   Vitals:   02/14/16 0450 02/14/16 1358 02/14/16 2120 02/15/16 0414  BP: (!) 144/79 (!) 154/87 (!) 153/85 (!) 158/86  Pulse: 61 75 73 68  Resp: 18 18 18 18   Temp: 97.5 F (36.4 C) 97.7 F (36.5 C) 98 F (36.7 C) 98 F (36.7 C)  TempSrc: Oral Oral Oral Oral  SpO2: 97% 98% 98% 98%  Weight:      Height:        General: Pt is alert, awake, not in acute distress.  Cardiovascular: RRR, S1/S2 +, no rubs, no gallops Respiratory: CTA bilaterally, no wheezing, no rhonchi Abdominal: Soft, Non tender, bowel sounds present Extremities: no edema.    The results of significant diagnostics from this hospitalization (including imaging, microbiology, ancillary and laboratory) are listed below for reference.     Microbiology: No results found for this or any previous visit (from the past 240 hour(s)).   Labs: BNP (last 3 results) No results for input(s): BNP in the last 8760 hours. Basic Metabolic Panel:  Recent Labs Lab 02/13/16 1032 02/14/16 0621 02/15/16 1023  NA 130* 126* 127*  K 4.2 4.4 4.2  CL  97* 97* 96*  CO2 23 23 22   GLUCOSE 103* 124* 151*  BUN 6 12 12   CREATININE 0.81 1.01 0.91  CALCIUM 8.9 8.1* 8.1*   Liver Function Tests:  Recent Labs Lab 02/13/16 1032 02/14/16 0621  AST 20 17  ALT 14* 14*  ALKPHOS 43 40  BILITOT 0.8 0.7  PROT 4.9* 4.3*  ALBUMIN 2.8* 2.3*   No results for input(s): LIPASE, AMYLASE in the last 168 hours. No results for input(s): AMMONIA in the last 168 hours. CBC:  Recent Labs Lab 02/13/16 1032 02/14/16 0621  WBC 10.7* 9.3  NEUTROABS 8.0*  --   HGB 18.3* 16.3  HCT 49.7 45.3  MCV 104.6* 105.3*  PLT 207 208   Cardiac Enzymes: No results for input(s): CKTOTAL, CKMB, CKMBINDEX, TROPONINI in the last 168 hours. BNP: Invalid input(s): POCBNP CBG:  Recent Labs Lab 02/14/16 0801  GLUCAP 126*   D-Dimer No results for input(s):  DDIMER in the last 72 hours. Hgb A1c No results for input(s): HGBA1C in the last 72 hours. Lipid Profile No results for input(s): CHOL, HDL, LDLCALC, TRIG, CHOLHDL, LDLDIRECT in the last 72 hours. Thyroid function studies No results for input(s): TSH, T4TOTAL, T3FREE, THYROIDAB in the last 72 hours.  Invalid input(s): FREET3 Anemia work up No results for input(s): VITAMINB12, FOLATE, FERRITIN, TIBC, IRON, RETICCTPCT in the last 72 hours. Urinalysis    Component Value Date/Time   COLORURINE STRAW (A) 12/18/2009 1952   APPEARANCEUR CLEAR 12/18/2009 1952   LABSPEC 1.010 12/18/2009 1952   PHURINE 7.5 12/18/2009 1952   GLUCOSEU NEGATIVE 12/18/2009 1952   HGBUR NEGATIVE 12/18/2009 1952   BILIRUBINUR N 05/18/2011 0845   KETONESUR NEGATIVE 12/18/2009 1952   PROTEINUR N 05/18/2011 0845   PROTEINUR NEGATIVE 12/18/2009 1952   UROBILINOGEN negative 05/18/2011 0845   UROBILINOGEN 0.2 12/18/2009 1952   NITRITE N 05/18/2011 0845   NITRITE NEGATIVE 12/18/2009 1952   LEUKOCYTESUR Negative 05/18/2011 0845   Sepsis Labs Invalid input(s): PROCALCITONIN,  WBC,  LACTICIDVEN Microbiology No results found for this or any previous visit (from the past 240 hour(s)).   Time coordinating discharge: 33 minutes  SIGNED:  Marcie Bal, PA-S [place] Cruzita Lederer, MD [facility] [phone]  If 7PM-7AM, please contact night-coverage [email or web address] Password [email or web address]

## 2016-02-15 NOTE — Progress Notes (Signed)
Discharge instructions given to pt's wife. Discharged to home via ambulance.

## 2016-02-15 NOTE — Progress Notes (Signed)
Daily Progress Note   Patient Name: Russell Dudley       Date: 02/15/2016 DOB: 09-Dec-1945  Age: 71 y.o. MRN#: [id number] Attending Physician: Caren Griffins, MD Primary Care Physician: Wyatt Haste, MD Admit Date: 02/13/2016  Reason for Consultation/Follow-up: Establishing goals of care  Subjective: Patient doing much better today. Sitting up in chair, eating lunch (hand fed by his wife)  . He is ready to go home. He was able to stand for 12 minutes with PT. They are in conversation with [facility] re: possibly arranging transportation to get him to treatments. If that cannot be arranged, then they are going to proceed with Hospice care. They feel decadron provides a great increase in quality of life and would like to continue taking it.   Review of Systems  Eyes: Positive for blurred vision.       Light sensitivity  Gastrointestinal: Positive for constipation.  Neurological: Positive for headaches.  Psychiatric/Behavioral: The patient is nervous/anxious.   All other systems reviewed and are negative.   Length of Stay: 1  Current Medications: Scheduled Meds:  . amLODipine  5 mg Oral Daily  . cloBAZam  15 mg Oral QHS  . dexamethasone  4 mg Intravenous Q6H  . hydrocortisone  15 mg Oral BID  . lisinopril  20 mg Oral Daily  . metoprolol succinate  50 mg Oral Daily  . ondansetron  4 mg Intravenous Once  . polyethylene glycol  17 g Oral Daily  . sertraline  50 mg Oral QHS  . zonisamide  300 mg Oral QHS    Continuous Infusions:   PRN Meds: bisacodyl, clonazePAM, ondansetron **OR** ondansetron (ZOFRAN) IV, sodium phosphate  Physical Exam  Constitutional: He is oriented to person, place, and time. He appears well-developed and well-nourished.  Cardiovascular: Normal rate and  regular rhythm.   Pulmonary/Chest: Effort normal and breath sounds normal.  Neurological: He is alert and oriented to person, place, and time.  Skin: Skin is warm and dry.            Vital Signs: BP (!) 158/86 (BP Location: Right Arm)   Pulse 68   Temp 98 F (36.7 C) (Oral)   Resp 18   Ht 5\' 9"  (1.753 m)   Wt 81.6 kg (180 lb)   SpO2 98%   BMI 26.58  kg/m  SpO2: SpO2: 98 % O2 Device: O2 Device: Not Delivered O2 Flow Rate:    Intake/output summary:  Intake/Output Summary (Last 24 hours) at 02/15/16 1434 Last data filed at 02/15/16 1125  Gross per 24 hour  Intake           1367.5 ml  Output             1101 ml  Net            266.5 ml   LBM: Last BM Date: 02/14/16 Baseline Weight: Weight: 81.6 kg (180 lb) Most recent weight: Weight: 81.6 kg (180 lb)       Palliative Assessment/Data: PPS: 50%    Flowsheet Rows   Flowsheet Row Most Recent Value  Intake Tab  Referral Department  -- [ED]  Unit at Time of Referral  ER  Palliative Care Primary Diagnosis  Cancer  Date Notified  02/13/16  Palliative Care Type  New Palliative care  Reason for referral  Clarify Goals of Care  Date of Admission  02/13/16  # of days IP prior to Palliative referral  0  Clinical Assessment  Psychosocial & Spiritual Assessment  Palliative Care Outcomes      Patient Active Problem List   Diagnosis Date Noted  . Palliative care by specialist   . Goals of care, counseling/discussion   . Meningioma ([place]) 02/13/2016  . Meningioma, malignant ([place]) 02/13/2016  . N&V (nausea and vomiting) 02/13/2016  . Debility 02/13/2016  . Polycythemia vera ([place]) 03/01/2015  . Leg pain, right 03/01/2015  . 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 Abnormality of gait 03/02/2014  . 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 Hypertension 05/18/2011  . Statin intolerance 05/18/2011  . ASHD (arteriosclerotic heart disease) 05/18/2011  . Hyperlipidemia LDL  goal < 70 05/18/2011  . Atypical meningioma of brain [facility]) 05/18/2011    Palliative Care Assessment & Plan   Patient Profile: 71 y.o. 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 Patient was being treated at [facility], but in the past few months it has become increasingly difficult to get him to his appointments. Palliative medicine consulted for [place].   Assessment/Recommendations/Plan   Family has [facility] info and will call when ready for Hospice care  [facility] for PT at home if no Hospice  Continue Decadron at discharge for symptom management   Goals of Care and Additional Recommendations:  Limitations on Scope of Treatment: Full Scope Treatment  Code Status:  DNR  Prognosis:   < 6 months d/t brain meningioma that is progressing despite  avastin therapy, patient becoming symptomatic without decadron  Discharge Planning:  Home with [place] was discussed with patient and family  Thank you for allowing the Palliative Medicine Team to assist in the care of this patient.   Total time: 25 minutes  Greater than 50%  of this time was spent counseling and coordinating care related to the above assessment and plan.  Mariana Kaufman, AGNP-C Palliative Medicine   Please contact Palliative Medicine Team phone at [phone] for questions and concerns.

## 2016-02-15 NOTE — Discharge Instructions (Signed)
Follow with [facility] in 1-2 weeks  Please get a complete blood count and chemistry panel checked by your Primary MD at your next visit, and again as instructed by your Primary MD. Please get your medications reviewed and adjusted by your Primary MD.  Please request your Primary MD to go over all Hospital Tests and Procedure/Radiological results at the follow up, please get all Hospital records sent to your Prim MD by signing hospital release before you go home.  If you had Pneumonia of Lung problems at the Hospital: Please get a 2 view Chest X ray done in 6-8 weeks after hospital discharge or sooner if instructed by your Primary MD.  If you have Congestive Heart Failure: Please call your Cardiologist or Primary MD anytime you have any of the following symptoms:  1) 3 pound weight gain in 24 hours or 5 pounds in 1 week  2) shortness of breath, with or without a dry hacking cough  3) swelling in the hands, feet or stomach  4) if you have to sleep on extra pillows at night in order to breathe  Follow cardiac low salt diet and 1.5 lit/day fluid restriction.  If you have diabetes Accuchecks 4 times/day, Once in AM empty stomach and then before each meal. Log in all results and show them to your primary doctor at your next visit. If any glucose reading is under 80 or above 300 call your primary MD immediately.  If you have Seizure/Convulsions/Epilepsy: Please do not drive, operate heavy machinery, participate in activities at heights or participate in high speed sports until you have seen by Primary MD or a Neurologist and advised to do so again.  If you had Gastrointestinal Bleeding: Please ask your Primary MD to check a complete blood count within one week of discharge or at your next visit. Your endoscopic/colonoscopic biopsies that are pending at the time of discharge, will also need to followed by your Primary MD.  Get Medicines reviewed and adjusted. Please take all your medications with  you for your next visit with your Primary MD  Please request your Primary MD to go over all hospital tests and procedure/radiological results at the follow up, please ask your Primary MD to get all Hospital records sent to his/her office.  If you experience worsening of your admission symptoms, develop shortness of breath, life threatening emergency, suicidal or homicidal thoughts you must seek medical attention immediately by calling 911 or calling your MD immediately  if symptoms less severe.  You must read complete instructions/literature along with all the possible adverse reactions/side effects for all the Medicines you take and that have been prescribed to you. Take any new Medicines after you have completely understood and accpet all the possible adverse reactions/side effects.   Do not drive or operate heavy machinery when taking Pain medications.   Do not take more than prescribed Pain, Sleep and Anxiety Medications  Special Instructions: If you have smoked or chewed Tobacco  in the last 2 yrs please stop smoking, stop any regular Alcohol  and or any Recreational drug use.  Wear Seat belts while driving.  Please note You were cared for by a hospitalist during your hospital stay. If you have any questions about your discharge medications or the care you received while you were in the hospital after you are discharged, you can call the unit and asked to speak with the hospitalist on call if the hospitalist that took care of you is not available. Once you are  discharged, your primary care physician will handle any further medical issues. Please note that NO REFILLS for any discharge medications will be authorized once you are discharged, as it is imperative that you return to your primary care physician (or establish a relationship with a primary care physician if you do not have one) for your aftercare needs so that they can reassess your need for medications and monitor your lab  values.  You can reach the hospitalist office at phone [phone] or fax [phone]   If you do not have a primary care physician, you can call [phone] for a physician referral.  Activity: As tolerated with Full fall precautions use walker/cane & assistance as needed  Diet: regular  Disposition Home

## 2016-02-15 NOTE — Progress Notes (Signed)
PRN nurse for floor. Assisting nurse with administering medications to patient.

## 2016-02-16 ENCOUNTER — Telehealth: Payer: Self-pay | Admitting: Family Medicine

## 2016-02-16 NOTE — Telephone Encounter (Signed)
Pt's wife called and informed.

## 2016-02-16 NOTE — Telephone Encounter (Signed)
Call her back and tell her we can hold off on that. Told to keep in touch with [place]

## 2016-02-16 NOTE — Telephone Encounter (Signed)
Call pt and spoke to wife to make an appt for hospital follow up. She states that pt is no longer ambulatory, so appt was not made. She states that she is checking with pt's insurance about them paying for a ambulance to come out and bring him to doctor appts. She states that once that is resolved she will call back and make an appt.

## 2016-04-05 ENCOUNTER — Inpatient Hospital Stay (HOSPITAL_COMMUNITY)
Admission: EM | Admit: 2016-04-05 | Discharge: 2016-04-05 | DRG: 054 | Disposition: A | Discharge status: Hospice | Payer: 59 | Attending: Internal Medicine | Admitting: Internal Medicine

## 2016-04-05 ENCOUNTER — Emergency Department (HOSPITAL_COMMUNITY): Payer: 59

## 2016-04-05 ENCOUNTER — Encounter (HOSPITAL_COMMUNITY): Payer: Self-pay

## 2016-04-05 DIAGNOSIS — Z79899 Other long term (current) drug therapy: Secondary | ICD-10-CM | POA: Diagnosis not present

## 2016-04-05 DIAGNOSIS — D320 Benign neoplasm of cerebral meninges: Secondary | ICD-10-CM | POA: Diagnosis not present

## 2016-04-05 DIAGNOSIS — Z888 Allergy status to other drugs, medicaments and biological substances status: Secondary | ICD-10-CM | POA: Diagnosis not present

## 2016-04-05 DIAGNOSIS — I2510 Atherosclerotic heart disease of native coronary artery without angina pectoris: Secondary | ICD-10-CM | POA: Diagnosis present

## 2016-04-05 DIAGNOSIS — K560 Paralytic ileus: Secondary | ICD-10-CM | POA: Diagnosis present

## 2016-04-05 DIAGNOSIS — E871 Hypo-osmolality and hyponatremia: Secondary | ICD-10-CM | POA: Diagnosis not present

## 2016-04-05 DIAGNOSIS — E872 Acidosis: Secondary | ICD-10-CM | POA: Diagnosis present

## 2016-04-05 DIAGNOSIS — E273 Drug-induced adrenocortical insufficiency: Secondary | ICD-10-CM | POA: Diagnosis present

## 2016-04-05 DIAGNOSIS — E876 Hypokalemia: Secondary | ICD-10-CM | POA: Diagnosis present

## 2016-04-05 DIAGNOSIS — R112 Nausea with vomiting, unspecified: Secondary | ICD-10-CM | POA: Diagnosis present

## 2016-04-05 DIAGNOSIS — E860 Dehydration: Secondary | ICD-10-CM | POA: Diagnosis present

## 2016-04-05 DIAGNOSIS — C719 Malignant neoplasm of brain, unspecified: Secondary | ICD-10-CM | POA: Diagnosis present

## 2016-04-05 DIAGNOSIS — D72829 Elevated white blood cell count, unspecified: Secondary | ICD-10-CM | POA: Diagnosis present

## 2016-04-05 DIAGNOSIS — E785 Hyperlipidemia, unspecified: Secondary | ICD-10-CM | POA: Diagnosis present

## 2016-04-05 DIAGNOSIS — Z7952 Long term (current) use of systemic steroids: Secondary | ICD-10-CM | POA: Diagnosis not present

## 2016-04-05 DIAGNOSIS — D420 Neoplasm of uncertain behavior of cerebral meninges: Secondary | ICD-10-CM

## 2016-04-05 DIAGNOSIS — R269 Unspecified abnormalities of gait and mobility: Secondary | ICD-10-CM

## 2016-04-05 DIAGNOSIS — R7989 Other specified abnormal findings of blood chemistry: Secondary | ICD-10-CM

## 2016-04-05 DIAGNOSIS — Z66 Do not resuscitate: Secondary | ICD-10-CM | POA: Diagnosis present

## 2016-04-05 DIAGNOSIS — G9349 Other encephalopathy: Secondary | ICD-10-CM | POA: Diagnosis present

## 2016-04-05 DIAGNOSIS — R569 Unspecified convulsions: Secondary | ICD-10-CM | POA: Diagnosis present

## 2016-04-05 DIAGNOSIS — E2749 Other adrenocortical insufficiency: Secondary | ICD-10-CM | POA: Diagnosis not present

## 2016-04-05 DIAGNOSIS — I10 Essential (primary) hypertension: Secondary | ICD-10-CM | POA: Diagnosis present

## 2016-04-05 DIAGNOSIS — I959 Hypotension, unspecified: Secondary | ICD-10-CM | POA: Diagnosis present

## 2016-04-05 DIAGNOSIS — Z515 Encounter for palliative care: Secondary | ICD-10-CM | POA: Diagnosis present

## 2016-04-05 LAB — COMPREHENSIVE METABOLIC PANEL
ALT: 17 U/L (ref 17–63)
AST: 18 U/L (ref 15–41)
Albumin: 2.4 g/dL — ABNORMAL LOW (ref 3.5–5.0)
Alkaline Phosphatase: 48 U/L (ref 38–126)
Anion gap: 11 (ref 5–15)
BUN: 17 mg/dL (ref 6–20)
CHLORIDE: 85 mmol/L — AB (ref 101–111)
CO2: 19 mmol/L — AB (ref 22–32)
CREATININE: 0.7 mg/dL (ref 0.61–1.24)
Calcium: 8.3 mg/dL — ABNORMAL LOW (ref 8.9–10.3)
GFR calc Af Amer: >60 mL/min (ref >60)
GFR calc non Af Amer: >60 mL/min (ref >60)
Glucose, Bld: 104 mg/dL — ABNORMAL HIGH (ref 65–99)
Potassium: 3 mmol/L — ABNORMAL LOW (ref 3.5–5.1)
Sodium: 115 mmol/L — CL (ref 135–145)
Total Bilirubin: 1 mg/dL (ref 0.3–1.2)
Total Protein: 5 g/dL — ABNORMAL LOW (ref 6.5–8.1)

## 2016-04-05 LAB — CBC WITH DIFFERENTIAL/PLATELET
Basophils Absolute: 0 10*3/uL (ref 0.0–0.1)
Basophils Relative: 0 %
EOS PCT: 0 %
Eosinophils Absolute: 0 10*3/uL (ref 0.0–0.7)
HCT: 43.9 % (ref 39.0–52.0)
Hemoglobin: 15.9 g/dL (ref 13.0–17.0)
LYMPHS ABS: 1.7 10*3/uL (ref 0.7–4.0)
Lymphocytes Relative: 12 %
MCH: 35.2 pg — AB (ref 26.0–34.0)
MCHC: 36.2 g/dL — ABNORMAL HIGH (ref 30.0–36.0)
MCV: 97.1 fL (ref 78.0–100.0)
MONO ABS: 1.4 10*3/uL — AB (ref 0.1–1.0)
Monocytes Relative: 9 %
Neutro Abs: 11.6 10*3/uL — ABNORMAL HIGH (ref 1.7–7.7)
Neutrophils Relative %: 79 %
PLATELETS: 163 10*3/uL (ref 150–400)
RBC: 4.52 MIL/uL (ref 4.22–5.81)
RDW: 15.6 % — AB (ref 11.5–15.5)
WBC: 14.7 10*3/uL — ABNORMAL HIGH (ref 4.0–10.5)

## 2016-04-05 LAB — BASIC METABOLIC PANEL
Anion gap: 8 (ref 5–15)
BUN: 16 mg/dL (ref 6–20)
CHLORIDE: 91 mmol/L — AB (ref 101–111)
CO2: 20 mmol/L — ABNORMAL LOW (ref 22–32)
Calcium: 7.9 mg/dL — ABNORMAL LOW (ref 8.9–10.3)
Creatinine, Ser: 0.68 mg/dL (ref 0.61–1.24)
GFR calc Af Amer: >60 mL/min (ref >60)
GFR calc non Af Amer: >60 mL/min (ref >60)
Glucose, Bld: 90 mg/dL (ref 65–99)
POTASSIUM: 3.5 mmol/L (ref 3.5–5.1)
Sodium: 119 mmol/L — CL (ref 135–145)

## 2016-04-05 LAB — URINALYSIS, ROUTINE W REFLEX MICROSCOPIC
BACTERIA UA: NONE SEEN
BILIRUBIN URINE: NEGATIVE
GLUCOSE, UA: 50 mg/dL — AB
Ketones, ur: NEGATIVE mg/dL
Leukocytes, UA: NEGATIVE
Nitrite: NEGATIVE
PH: 5 (ref 5.0–8.0)
PROTEIN: 100 mg/dL — AB
Specific Gravity, Urine: 1.018 (ref 1.005–1.030)

## 2016-04-05 LAB — NA AND K (SODIUM & POTASSIUM), RAND UR
Potassium Urine: 20 mmol/L
Sodium, Ur: <10 mmol/L

## 2016-04-05 LAB — AMMONIA: Ammonia: 71 umol/L — ABNORMAL HIGH (ref 9–35)

## 2016-04-05 LAB — I-STAT CG4 LACTIC ACID, ED: LACTIC ACID, VENOUS: 1.23 mmol/L (ref 0.5–1.9)

## 2016-04-05 LAB — LIPASE, BLOOD: Lipase: 15 U/L (ref 11–51)

## 2016-04-05 LAB — OSMOLALITY, URINE: Osmolality, Ur: 487 mOsm/kg (ref 300–900)

## 2016-04-05 MED ORDER — LACTULOSE ENEMA: 300.0000 mL | Freq: Once | ORAL | Status: DC

## 2016-04-05 MED ORDER — POTASSIUM CHLORIDE 10 MEQ/100ML IV SOLN
10.0000 meq | INTRAVENOUS | Status: AC
  Administered 2016-04-05 (×3): 10 meq via INTRAVENOUS
  Filled 2016-04-05 (×3): qty 100

## 2016-04-05 MED ORDER — HALOPERIDOL LACTATE 2 MG/ML PO CONC
0.5000 mg | ORAL | Status: DC | PRN
  Filled 2016-04-05: qty 0.3

## 2016-04-05 MED ORDER — ONDANSETRON HCL 4 MG/2ML IJ SOLN: 4.0000 mg | Freq: Four times a day (QID) | INTRAMUSCULAR | Status: DC | PRN

## 2016-04-05 MED ORDER — GLYCOPYRROLATE 0.2 MG/ML IJ SOLN: 0.2000 mg | INTRAMUSCULAR | Status: DC | PRN

## 2016-04-05 MED ORDER — ACETAMINOPHEN 650 MG RE SUPP: 650.0000 mg | Freq: Four times a day (QID) | RECTAL | Status: DC | PRN

## 2016-04-05 MED ORDER — IOPAMIDOL (ISOVUE-300) INJECTION 61%
100.0000 mL | Freq: Once | INTRAVENOUS | Status: AC | PRN
  Administered 2016-04-05: 100 mL via INTRAVENOUS

## 2016-04-05 MED ORDER — GLYCOPYRROLATE 1 MG PO TABS
1.0000 mg | ORAL_TABLET | ORAL | Status: DC | PRN
  Filled 2016-04-05: qty 1

## 2016-04-05 MED ORDER — SODIUM CHLORIDE 0.9% FLUSH: 3.0000 mL | INTRAVENOUS | Status: DC | PRN

## 2016-04-05 MED ORDER — POTASSIUM CHLORIDE 2 MEQ/ML IV SOLN: 30.0000 meq | Freq: Once | INTRAVENOUS | Status: DC

## 2016-04-05 MED ORDER — SODIUM CHLORIDE 0.9 % IV SOLN: 30.0000 meq | Freq: Once | INTRAVENOUS | Status: DC

## 2016-04-05 MED ORDER — MORPHINE SULFATE (PF) 4 MG/ML IV SOLN
4.0000 mg | Freq: Once | INTRAVENOUS | Status: AC
  Administered 2016-04-05: 4 mg via INTRAVENOUS
  Filled 2016-04-05: qty 1

## 2016-04-05 MED ORDER — DEXAMETHASONE SODIUM PHOSPHATE 10 MG/ML IJ SOLN
4.0000 mg | Freq: Four times a day (QID) | INTRAMUSCULAR | Status: DC
  Administered 2016-04-05: 4 mg via INTRAVENOUS
  Filled 2016-04-05: qty 1

## 2016-04-05 MED ORDER — POLYVINYL ALCOHOL 1.4 % OP SOLN: 1.0000 [drp] | Freq: Four times a day (QID) | OPHTHALMIC | Status: DC | PRN

## 2016-04-05 MED ORDER — SODIUM CHLORIDE 0.9 % IV BOLUS (SEPSIS)
1000.0000 mL | Freq: Once | INTRAVENOUS | Status: AC
  Administered 2016-04-05: 1000 mL via INTRAVENOUS

## 2016-04-05 MED ORDER — ONDANSETRON 4 MG PO TBDP: 4.0000 mg | ORAL_TABLET | Freq: Four times a day (QID) | ORAL | Status: DC | PRN

## 2016-04-05 MED ORDER — MORPHINE SULFATE (PF) 4 MG/ML IV SOLN: 2.0000 mg | INTRAVENOUS | Status: DC | PRN

## 2016-04-05 MED ORDER — ONDANSETRON HCL 4 MG/2ML IJ SOLN
4.0000 mg | Freq: Once | INTRAMUSCULAR | Status: AC
  Administered 2016-04-05: 4 mg via INTRAVENOUS
  Filled 2016-04-05: qty 2

## 2016-04-05 MED ORDER — ACETAMINOPHEN 325 MG PO TABS: 650.0000 mg | ORAL_TABLET | Freq: Four times a day (QID) | ORAL | Status: DC | PRN

## 2016-04-05 MED ORDER — BIOTENE DRY MOUTH MT LIQD: 15.0000 mL | OROMUCOSAL | Status: DC | PRN

## 2016-04-05 MED ORDER — IOPAMIDOL (ISOVUE-300) INJECTION 61%
INTRAVENOUS | Status: AC
  Filled 2016-04-05: qty 100

## 2016-04-05 MED ORDER — SODIUM CHLORIDE 0.9 % IV SOLN: 250.0000 mL | INTRAVENOUS | Status: DC | PRN

## 2016-04-05 MED ORDER — SODIUM CHLORIDE 0.9% FLUSH
3.0000 mL | Freq: Two times a day (BID) | INTRAVENOUS | Status: DC
  Administered 2016-04-05: 3 mL via INTRAVENOUS

## 2016-04-05 MED ORDER — LORAZEPAM 2 MG/ML IJ SOLN: 1.0000 mg | INTRAMUSCULAR | Status: DC | PRN

## 2016-04-05 MED ORDER — GLYCERIN (LAXATIVE) 2.1 G RE SUPP: 1.0000 | Freq: Once | RECTAL | Status: DC

## 2016-04-05 MED ORDER — SODIUM CHLORIDE 0.9 % IV SOLN
INTRAVENOUS | Status: DC
  Administered 2016-04-05: 06:00:00 via INTRAVENOUS

## 2016-04-05 MED ORDER — LORAZEPAM 2 MG/ML PO CONC: 1.0000 mg | ORAL | Status: DC | PRN

## 2016-04-05 MED ORDER — HALOPERIDOL LACTATE 5 MG/ML IJ SOLN: 0.5000 mg | INTRAMUSCULAR | Status: DC | PRN

## 2016-04-05 MED ORDER — LORAZEPAM 1 MG PO TABS: 1.0000 mg | ORAL_TABLET | ORAL | Status: DC | PRN

## 2016-04-05 MED ORDER — METHYLPREDNISOLONE SODIUM SUCC 125 MG IJ SOLR
60.0000 mg | Freq: Four times a day (QID) | INTRAMUSCULAR | Status: DC
Start: 2016-04-05 — End: 2016-04-05
  Filled 2016-04-05: qty 2

## 2016-04-05 MED ORDER — HALOPERIDOL 1 MG PO TABS: 0.5000 mg | ORAL_TABLET | ORAL | Status: DC | PRN

## 2016-04-05 NOTE — Discharge Summary (Signed)
Physician Discharge Summary  Russell Dudley ZSW:[id number] DOB: 03-24-1945 DOA: 04/05/2016  PCP: Wyatt Haste, MD  Admit date: 04/05/2016 Discharge date: 04/05/2016  Admitted From: home Disposition:  Residential hospice  Recommendations for Outpatient Follow-up:  1. Discharge to residential hospice  Discharge Condition: guarded CODE STATUS: DNR Diet recommendation: comfort feeding  HPI: Russell Dudley is a 71 y.o. male with medical history significant of Atypical meningioma of brain, HTN, iatrogenic adrenal insufficiency, and seizures who presented to the ED complaining of acute onset of moderate abdominal pain x 3 days. Patient doesn't give much history. His wife also reports abdominal swelling, nausea, and vomiting.The patient has had decrease in appetite over the last 2 weeks and has had minimal fluid intake over the last few days with decrease in urine out put. The wife also recalls trouble with diarrhea a few times over the last month with a severe episode on Sunday. She gave the patient imodium and the diarrhea cleared up. No other alleviating factors noted. He has had no recent abdominal surgeries.   ED Course: In the emergency room patient's vital signs were relatively stable, his blood pressure was initially normal but then he become slightly hypotensive, his BMP was significant for a sodium of 115, and CBC shows a mild leukocytosis with a white count of 14.  TRH was asked for admission for hyponatremia  Hospital Course: Discharge Diagnoses:  Active Problems:   Hypertension   Atypical meningioma of brain (HCC)   Iatrogenic adrenal insufficiency (HCC)   Abnormality of gait   N&V (nausea and vomiting)   Hyponatremia  GOC  -Discussed with wife at bedside, she understands patient has a terminal illness and his brain cancer has progressed despite treatment.  He is now under hospice.  She would not want aggressive interventions at this point, and wants his care focused on  comfort alone and wants him to pass with dignity.  Discussed with hospice liaison, patient will be transitioned to be current place for residential hospice as anticipate death in hours >> days Ileus with nausea and vomiting- ? Related to GI illness few days ago,care now transitioned towards comfort Acute encephalopathy- likely due to known extensive intracranial tumor / known edema which is likely to have progressed Hyponatremia - due to dehydration, now comfort care, no IV fluids Hypokalemia  Non-anion gap metabolic acidosis  Hypotension Atypical meningioma of brain -currently off of chemotherapy and on hospice Iatrogenic adrenal insufficiency Seizures   Discharge Instructions   Allergies as of 04/05/2016      Reactions   Metoclopramide Hcl    Muscle spasms       Medication List    STOP taking these medications   amLODipine 5 MG tablet Commonly known as:  NORVASC   dexamethasone 4 MG tablet Commonly known as:  DECADRON   lisinopril 20 MG tablet Commonly known as:  PRINIVIL,ZESTRIL   metoprolol succinate 50 MG 24 hr tablet Commonly known as:  TOPROL-XL   ONFI 10 MG tablet Generic drug:  cloBAZam   PRESCRIPTION MEDICATION   sertraline 50 MG tablet Commonly known as:  ZOLOFT     TAKE these medications   clonazePAM 0.5 MG tablet Commonly known as:  KLONOPIN Take 0.5 mg by mouth 2 (two) times daily as needed for anxiety.   haloperidol 2 MG tablet Commonly known as:  HALDOL Take 4 mg by mouth every 4 (four) hours as needed for agitation.   hydrocortisone 5 MG tablet Commonly known as:  CORTEF Take 15  mg by mouth 2 (two) times daily.   ondansetron 8 MG tablet Commonly known as:  ZOFRAN Take 8 mg by mouth every 8 (eight) hours as needed for nausea or vomiting.   zonisamide 100 MG capsule Commonly known as:  ZONEGRAN Take 300 mg by mouth at bedtime.       Allergies  Allergen Reactions  . Metoclopramide Hcl     Muscle spasms     Consultations:  None    Procedures/Studies:   Ct Abdomen Pelvis W Contrast  Result Date: 04/05/2016 CLINICAL DATA:  Acute onset of generalized abdominal pain and swelling. Decreased urinary output. Diarrhea. Nausea and vomiting. Initial encounter. EXAM: CT ABDOMEN AND PELVIS WITH CONTRAST TECHNIQUE: Multidetector CT imaging of the abdomen and pelvis was performed using the standard protocol following bolus administration of intravenous contrast. CONTRAST:  150mL ISOVUE-300 IOPAMIDOL (ISOVUE-300) INJECTION 61% COMPARISON:  None. FINDINGS: Lower chest: A trace right pleural effusion is noted. Mild bibasilar atelectasis is noted. Mild wall thickening at the distal esophagus may reflect chronic inflammation or possibly mild esophagitis. Hepatobiliary: The liver is grossly unremarkable in appearance. The gallbladder is within normal limits. There is interposition of the hepatic flexure of the colon anterior to the liver. The common bile duct remains normal in caliber. Pancreas: The pancreas is within normal limits. Spleen: The spleen is unremarkable in appearance. Adrenals/Urinary Tract: The adrenal glands are unremarkable in appearance. The kidneys are within normal limits. There is no evidence of hydronephrosis. No renal or ureteral stones are identified. No perinephric stranding is seen. Stomach/Bowel: The stomach is unremarkable in appearance. The small bowel is largely filled with fluid and air. It is mostly borderline normal in caliber, though it appears to clump at the right lower quadrant, possibly reflecting mild small bowel dysmotility due to an underlying adhesion. Mild associated free fluid is noted. The colon is distended to 8 cm in maximal diameter, raising concern for underlying ileus. Fluid and air tracks throughout the colon to the level of the rectum. Vascular/Lymphatic: Scattered calcification is seen along the abdominal aorta and its branches. The abdominal aorta is otherwise grossly unremarkable. The inferior vena  cava is grossly unremarkable. No retroperitoneal lymphadenopathy is seen. No pelvic sidewall lymphadenopathy is identified. Reproductive: The bladder is decompressed and not well assessed. The prostate remains normal in size. Other: Mild soft tissue edema is seen tracking along the lateral abdominal and pelvic wall bilaterally. Musculoskeletal: No acute osseous abnormalities are identified. The visualized musculature is unremarkable in appearance. IMPRESSION: 1. Distention of the colon to 8 cm in maximal diameter, raising concern for underlying ileus. Fluid and air noted tracking throughout the colon to the level of the rectum. 2. Small bowel is largely filled with fluid and air. It appears to lump at the right lower quadrant, possibly reflecting mild small bowel dysmotility due to an underlying adhesion. No evidence of bowel obstruction. Mild associated free fluid noted. 3. Trace right pleural effusion.  Bibasilar atelectasis noted. 4. Scattered aortic atherosclerosis. 5. Mild wall thickening at the distal esophagus may reflect chronic inflammation or possibly mild esophagitis. 6. Mild soft tissue edema along the lateral abdominal and pelvic wall bilaterally. Electronically Signed   By: Garald Balding M.D.   On: 04/05/2016 04:17      Subjective: -unresponsive  Discharge Exam: Vitals:   04/05/16 0925 04/05/16 1245  BP: (!) 87/61 91/63  Pulse: 74 82  Resp: 15 17  Temp:     Vitals:   04/05/16 0503 04/05/16 0636 04/05/16 0925 04/05/16  1245  BP: 127/86 117/79 (!) 87/61 91/63  Pulse: 73 73 74 82  Resp: 18 14 15 17   Temp:      TempSrc:      SpO2: 98% 96% 91% 93%  Weight:      Height:        General: Pt is lethargic Cardiovascular: RRR, S1/S2 +, no rubs, no gallops Respiratory: CTA bilaterally, no wheezing, no rhonchi Abdominal: distended    The results of significant diagnostics from this hospitalization (including imaging, microbiology, ancillary and laboratory) are listed below for  reference.     Microbiology: No results found for this or any previous visit (from the past 240 hour(s)).   Labs: BNP (last 3 results) No results for input(s): BNP in the last 8760 hours. Basic Metabolic Panel:  Recent Labs Lab 04/05/16 0233 04/05/16 0814  NA 115* 119*  K 3.0* 3.5  CL 85* 91*  CO2 19* 20*  GLUCOSE 104* 90  BUN 17 16  CREATININE 0.70 0.68  CALCIUM 8.3* 7.9*   Liver Function Tests:  Recent Labs Lab 04/05/16 0233  AST 18  ALT 17  ALKPHOS 48  BILITOT 1.0  PROT 5.0*  ALBUMIN 2.4*    Recent Labs Lab 04/05/16 0233  LIPASE 15    Recent Labs Lab 04/05/16 0211  AMMONIA 71*   CBC:  Recent Labs Lab 04/05/16 0309  WBC 14.7*  NEUTROABS 11.6*  HGB 15.9  HCT 43.9  MCV 97.1  PLT 163   Cardiac Enzymes: No results for input(s): CKTOTAL, CKMB, CKMBINDEX, TROPONINI in the last 168 hours. BNP: Invalid input(s): POCBNP CBG: No results for input(s): GLUCAP in the last 168 hours. D-Dimer No results for input(s): DDIMER in the last 72 hours. Hgb A1c No results for input(s): HGBA1C in the last 72 hours. Lipid Profile No results for input(s): CHOL, HDL, LDLCALC, TRIG, CHOLHDL, LDLDIRECT in the last 72 hours. Thyroid function studies No results for input(s): TSH, T4TOTAL, T3FREE, THYROIDAB in the last 72 hours.  Invalid input(s): FREET3 Anemia work up No results for input(s): VITAMINB12, FOLATE, FERRITIN, TIBC, IRON, RETICCTPCT in the last 72 hours. Urinalysis    Component Value Date/Time   COLORURINE YELLOW 04/05/2016 0217   APPEARANCEUR CLEAR 04/05/2016 0217   LABSPEC 1.018 04/05/2016 0217   PHURINE 5.0 04/05/2016 0217   GLUCOSEU 50 (A) 04/05/2016 0217   HGBUR SMALL (A) 04/05/2016 0217   BILIRUBINUR NEGATIVE 04/05/2016 0217   BILIRUBINUR N 05/18/2011 0845   KETONESUR NEGATIVE 04/05/2016 0217   PROTEINUR 100 (A) 04/05/2016 0217   UROBILINOGEN negative 05/18/2011 0845   UROBILINOGEN 0.2 12/18/2009 1952   NITRITE NEGATIVE 04/05/2016 0217    LEUKOCYTESUR NEGATIVE 04/05/2016 0217   Sepsis Labs Invalid input(s): PROCALCITONIN,  WBC,  LACTICIDVEN Microbiology No results found for this or any previous visit (from the past 240 hour(s)).   Time coordinating discharge: 35 minutes  SIGNED:  Marzetta Board, MD  [facility] 04/05/2016, 1:01 PM Pager [phone]  If 7PM-7AM, please contact night-coverage [email or web address] Password [email or web address]

## 2016-04-05 NOTE — Progress Notes (Signed)
Palliative Medicine RN Note: Rec'd a call from [facility]. The family agrees to full comfort care, but there are no beds at [facility]. She indicated that pt will be admitted as GIP. PMT recommends initiation of comfort order set to control symptoms as appropriate. PMT will shadow chart, as goals and plan are agreed upon by family and hospice; hospice will manage d/c planning and discussions. Please notify our team if we can be of further assistance.  Marjie Skiff Cordai Rodrigue, RN, BSN, [facility] 04/05/2016 11:06 AM Cell [phone] 8:00-4:00 Monday-Friday Office [phone]

## 2016-04-05 NOTE — ED Notes (Signed)
Bed: PR91 Expected date:  Expected time:  Means of arrival:  Comments: EMS 71 yo male abdominal pain and distention

## 2016-04-05 NOTE — Progress Notes (Signed)
1015--[facility] Hospital Liaison RN Note-- [facility] ED 24  Met with patient and wife in room briefly and then with wife Vaughan Basta, outside of the room. Mr. Nole lying in bed quietly with eyes closed. He does respond to soft voice and light touch. He denies any pain currently but does nod that the last couple of weeks have been increasingly rough.  Vaughan Basta and I had a discussion outside of the pt's room that Mr. Scalisi has expressed he just wants to go. She shared she knows he is not living a quality life. We discussed the last several days with nausea, vomiting, diarrhea, increasing abdominal distention and pain. We also discussed proceeding with fluid resuscitation, lactulose enemas and other interventions. Vaughan Basta states that she believes it is time to stop therapies and focus on comfort care only. She has requested a bed at [facility], though today there is not a bed to offer. We discussed admission to the hospital for full comfort care and anticipating EOL. Which Vaughan Basta wife requests we proceed with.  Offered to call any family, friends or chaplaincy support. Vaughan Basta is going to call her daughter in [place] to come. Vaughan Basta did request for [facility] social work visit to discuss funeral homes list. She expresses that Mr. Lachney does desire cremation. She also expresses that she would like a [facility] chaplain visit and given their faith being Catholic, she shares that [place] would desire last rites. Ollen Gross, CSW with [facility] phoned and message left. Deliah Boston, [facility] chaplain notified of above requests.  Spoke with Dr. Cruzita Lederer regarding above. He will proceed with entering orders for comfort care. Randi, RN notified of plan.  Please feel free to call with any further questions or concerns.  Thank you, Margaretmary Eddy, [facility] Liaison [phone]  [place] are now on [place]. After hours please call [phone].

## 2016-04-05 NOTE — Progress Notes (Addendum)
1300--[facility] Hospital Liaison RN Visit  Received request from family for [facility] with request to transfer today. Initially, bed unavailable. Now able to offer bed to transfer today. Met with wife Vaughan Basta, son Winter and daughter outside of pt's room to confirm transfer. Family agreeable to transfer today. Registration paperwork completed. Dr. Orpah Melter to continue care per family request.   Discharge summary was faxed to [phone].  RN please call report to [phone].  [facility] was called for transport at 1345. Please feel free to call me with any questions or concerns.  Thank you, Margaretmary Eddy, [facility] Liaison [phone]

## 2016-04-05 NOTE — ED Provider Notes (Signed)
[facility] DEPT Provider Note   CSN: [id number] Arrival date & time: 04/05/16  0112   By signing my name below, I, Delton Prairie, attest that this documentation has been prepared under the direction and in the presence of Delora Fuel, MD  Electronically Signed: Delton Prairie, ED Scribe. 04/05/16. 1:48 AM.   History   Chief Complaint Chief Complaint  Patient presents with  . Abdominal Pain  . Emesis   The history is provided by the spouse. No language interpreter was used.   HPI Comments: LEVEL 5 CAVEAT DUE TO ALTERED MENTAL STATUS   [name] is a 71 y.o. male, with a PMHx of cancer, ASHD and HTN, who presents to the Emergency Department complaining of acute onset, moderate abdominal pain x 3 days. Relative also reports abdominal swelling, a decrease in urine, decrease in appetite, decrease in fluid intake, diarrhea x 2 days, nausea and vomiting. She notes the pt had a small bowel movement yesterday. No alleviating factors noted. Relative denies a hx of abdominal surgeries or any other associated symptoms. No other complaints noted.   PCP: Wyatt Haste, MD  Past Medical History:  Diagnosis Date  . Allergic rhinitis   . ASHD (arteriosclerotic heart disease)   . Cancer [facility])    brain tumor; "atypical, grade 2" (02/14/2016)  . Dyslipidemia   . History of blood transfusion 2006   "during OR"  . History of meningioma   . Hypertension   . Seizures ([place])    "partial; on daily RX; last sz was in 2017" (02/14/2016)    Patient Active Problem List   Diagnosis Date Noted  . Palliative care by specialist   . Goals of care, counseling/discussion   . Meningioma ([place]) 02/13/2016  . Meningioma, malignant ([place]) 02/13/2016  . N&V (nausea and vomiting) 02/13/2016  . Debility 02/13/2016  . Polycythemia vera ([place]) 03/01/2015  . Leg pain, right 03/01/2015  . Iatrogenic adrenal insufficiency ([place]) 03/02/2014  . Abnormality of gait 03/02/2014  . 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 Hypertension 05/18/2011  . Statin intolerance 05/18/2011  . ASHD (arteriosclerotic heart disease) 05/18/2011  . Hyperlipidemia LDL goal < 70 05/18/2011  . 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 BRAIN MENINGIOMA EXCISION  2006; 2011  . COLONOSCOPY  2008   Dr. Carlean Purl  . FRACTURE SURGERY    . NASAL FRACTURE SURGERY    . NASAL SINUS SURGERY     "related to broken nose"  . TONSILLECTOMY AND ADENOIDECTOMY  1950s       Home Medications    Prior to Admission medications   Medication Sig Start Date End Date Taking? Authorizing Provider  amLODipine (NORVASC) 5 MG tablet TAKE 1 TABLET BY MOUTH  DAILY 02/07/16   Denita Lung, MD  clobazam (ONFI) 10 MG tablet Take 15 mg by mouth daily.    Historical Provider, MD  clonazePAM (KLONOPIN) 0.5 MG tablet Take 0.5 mg by mouth 2 (two) times daily as needed.    Historical Provider, MD  dexamethasone (DECADRON) 4 MG tablet Take 1 tablet (4 mg total) by mouth 2 (two) times daily with a meal. X 7 days then 4 mg daily x 7 days then return to prior dose 02/15/16   Costin Karlyne Greenspan, MD  hydrocortisone (CORTEF) 5 MG tablet Take 15 mg by mouth 2 (two) times daily.    Historical Provider, MD  lisinopril (PRINIVIL,ZESTRIL) 20 MG  tablet TAKE 1 TABLET BY MOUTH  DAILY 12/23/15   Denita Lung, MD  metoprolol succinate (TOPROL-XL) 50 MG 24 hr tablet Take 1 tablet (50 mg total) by mouth daily. Take with or immediately following a meal. 01/21/16   Denita Lung, MD  ondansetron (ZOFRAN) 8 MG tablet Take 8 mg by mouth every 8 (eight) hours as needed for nausea or vomiting.    Historical Provider, MD  [facility]. Last treatment 01-21-16. Followed by Dr Encarnacion Slates:    Historical Provider, MD  sertraline (ZOLOFT) 50 MG tablet Take 50 mg by mouth at bedtime.    Historical Provider, MD  zonisamide (ZONEGRAN) 100  MG capsule Take 300 mg by mouth at bedtime.     Historical Provider, MD    Family History History reviewed. No pertinent family history.  Social History Social History  Substance Use Topics  . Smoking status: Never Smoker  . Smokeless tobacco: Never Used  . Alcohol use Yes     Comment: 02/14/2016 "none since ~ 2015"     Allergies   Metoclopramide hcl   Review of Systems Review of Systems  Unable to perform ROS: Mental status change   Physical Exam Updated Vital Signs Temp 97.5 F (36.4 C) (Oral)   Physical Exam  Constitutional: He appears well-developed and well-nourished.  HENT:  Head: Normocephalic and atraumatic.  Eyes: EOM are normal. Pupils are equal, round, and reactive to light.  Neck: Normal range of motion. Neck supple. No JVD present.  Cardiovascular: Normal rate, regular rhythm and normal heart sounds.   No murmur heard. Pulmonary/Chest: Effort normal and breath sounds normal. He has no wheezes. He has no rales. He exhibits no tenderness.  Abdominal: Soft. He exhibits distension. He exhibits no mass. Bowel sounds are decreased. There is tenderness. There is no rebound and no guarding.  distended with mild tenderness to left side of abdomen. No rebound or guarding. Bowel sounds decreased with high pitched tinkling sounds.   Musculoskeletal: Normal range of motion. He exhibits no edema.  1+ pedal edema  Lymphadenopathy:    He has no cervical adenopathy.  Neurological: He is alert. No cranial nerve deficit. He exhibits normal muscle tone. Coordination normal.  Somnolent but arousable. Minimal speech. Minimal voluntary movement.   Skin: Skin is warm and dry. No rash noted.  Psychiatric: He has a normal mood and affect. His behavior is normal.  Nursing note and vitals reviewed.    ED Treatments / Results  COORDINATION OF CARE:  1:43 AM Discussed treatment plan with spouse at bedside and she agreed to plan.  Labs (all labs ordered are listed, but only  abnormal results are displayed) Labs Reviewed  URINALYSIS, ROUTINE W REFLEX MICROSCOPIC - Abnormal; Notable for the following:       Result Value   Glucose, UA 50 (*)    Hgb urine dipstick SMALL (*)    Protein, ur 100 (*)    Squamous Epithelial / LPF 0-5 (*)    All other components within normal limits  AMMONIA - Abnormal; Notable for the following:    Ammonia 71 (*)    All other components within normal limits  COMPREHENSIVE METABOLIC PANEL - Abnormal; Notable for the following:    Sodium 115 (*)    Potassium 3.0 (*)    Chloride 85 (*)    CO2 19 (*)    Glucose, Bld 104 (*)    Calcium 8.3 (*)    Total Protein 5.0 (*)  Albumin 2.4 (*)    All other components within normal limits  CBC WITH DIFFERENTIAL/PLATELET - Abnormal; Notable for the following:    WBC 14.7 (*)    MCH 35.2 (*)    MCHC 36.2 (*)    RDW 15.6 (*)    Neutro Abs 11.6 (*)    Monocytes Absolute 1.4 (*)    All other components within normal limits  LIPASE, BLOOD  CBC WITH DIFFERENTIAL/PLATELET  NA AND K (SODIUM & POTASSIUM), RAND UR  OSMOLALITY, URINE  I-STAT CG4 LACTIC ACID, ED    Radiology Ct Abdomen Pelvis W Contrast  Result Date: 04/05/2016 CLINICAL DATA:  Acute onset of generalized abdominal pain and swelling. Decreased urinary output. Diarrhea. Nausea and vomiting. Initial encounter. EXAM: CT ABDOMEN AND PELVIS WITH CONTRAST TECHNIQUE: Multidetector CT imaging of the abdomen and pelvis was performed using the standard protocol following bolus administration of intravenous contrast. CONTRAST:  173mL ISOVUE-300 IOPAMIDOL (ISOVUE-300) INJECTION 61% COMPARISON:  None. FINDINGS: Lower chest: A trace right pleural effusion is noted. Mild bibasilar atelectasis is noted. Mild wall thickening at the distal esophagus may reflect chronic inflammation or possibly mild esophagitis. Hepatobiliary: The liver is grossly unremarkable in appearance. The gallbladder is within normal limits. There is interposition of the hepatic  flexure of the colon anterior to the liver. The common bile duct remains normal in caliber. Pancreas: The pancreas is within normal limits. Spleen: The spleen is unremarkable in appearance. Adrenals/Urinary Tract: The adrenal glands are unremarkable in appearance. The kidneys are within normal limits. There is no evidence of hydronephrosis. No renal or ureteral stones are identified. No perinephric stranding is seen. Stomach/Bowel: The stomach is unremarkable in appearance. The small bowel is largely filled with fluid and air. It is mostly borderline normal in caliber, though it appears to clump at the right lower quadrant, possibly reflecting mild small bowel dysmotility due to an underlying adhesion. Mild associated free fluid is noted. The colon is distended to 8 cm in maximal diameter, raising concern for underlying ileus. Fluid and air tracks throughout the colon to the level of the rectum. Vascular/Lymphatic: Scattered calcification is seen along the abdominal aorta and its branches. The abdominal aorta is otherwise grossly unremarkable. The inferior vena cava is grossly unremarkable. No retroperitoneal lymphadenopathy is seen. No pelvic sidewall lymphadenopathy is identified. Reproductive: The bladder is decompressed and not well assessed. The prostate remains normal in size. Other: Mild soft tissue edema is seen tracking along the lateral abdominal and pelvic wall bilaterally. Musculoskeletal: No acute osseous abnormalities are identified. The visualized musculature is unremarkable in appearance. IMPRESSION: 1. Distention of the colon to 8 cm in maximal diameter, raising concern for underlying ileus. Fluid and air noted tracking throughout the colon to the level of the rectum. 2. Small bowel is largely filled with fluid and air. It appears to lump at the right lower quadrant, possibly reflecting mild small bowel dysmotility due to an underlying adhesion. No evidence of bowel obstruction. Mild associated free  fluid noted. 3. Trace right pleural effusion.  Bibasilar atelectasis noted. 4. Scattered aortic atherosclerosis. 5. Mild wall thickening at the distal esophagus may reflect chronic inflammation or possibly mild esophagitis. 6. Mild soft tissue edema along the lateral abdominal and pelvic wall bilaterally. Electronically Signed   By: Garald Balding M.D.   On: 04/05/2016 04:17    Procedures Procedures (including critical care time) CRITICAL CARE Performed by: [email or web address] Total critical care time: 50 minutes Critical care time was exclusive of separately billable procedures and treating  other patients. Critical care was necessary to treat or prevent imminent or life-threatening deterioration. Critical care was time spent personally by me on the following activities: development of treatment plan with patient and/or surrogate as well as nursing, discussions with consultants, evaluation of patient's response to treatment, examination of patient, obtaining history from patient or surrogate, ordering and performing treatments and interventions, ordering and review of laboratory studies, ordering and review of radiographic studies, pulse oximetry and re-evaluation of patient's condition.  Medications Ordered in ED Medications  iopamidol (ISOVUE-300) 61 % injection (not administered)  potassium chloride 10 mEq in 100 mL IVPB (10 mEq Intravenous New Bag/Given 04/05/16 0609)  morphine 4 MG/ML injection 2-4 mg (not administered)  0.9 %  sodium chloride infusion ( Intravenous New Bag/Given 04/05/16 0609)  sodium chloride 0.9 % bolus 1,000 mL (0 mLs Intravenous Stopped 04/05/16 0355)  ondansetron (ZOFRAN) injection 4 mg (4 mg Intravenous Given 04/05/16 0232)  morphine 4 MG/ML injection 4 mg (4 mg Intravenous Given 04/05/16 0232)  iopamidol (ISOVUE-300) 61 % injection 100 mL (100 mLs Intravenous Contrast Given 04/05/16 0328)     Initial Impression / Assessment and Plan / ED Course  I have reviewed the triage  vital signs and the nursing notes.  Pertinent labs & imaging results that were available during my care of the patient were reviewed by me and considered in my medical decision making (see chart for details).  Patient and her hospice care because of recurrent meningioma presenting with massive abdominal distention. Old records are reviewed, and he is noted to have had problems with nausea, but no significant problems with abdominal pain. He is sent for CT of abdomen and pelvis which shows ileus, but no other acute intra-abdominal process. Electrolytes show a severe hyponatremia. He had pre-existing hyponatremia, but it is much worse today. Also, he has hypokalemia and metabolic acidosis without increased anion gap. He was given IV fluids and intravenous potassium. I discussed treatment options with respect to his status is a hospice patient. It is felt that correction of his electrolytes cannot be done adequately at home, so arrangements are made to admit the patient. Case is discussed with Dr. Alcario Drought of [facility] who agrees to admit the patient. Other significant abnormality in laboratory workup was mildly elevated ammonia. I do not feel that this is clinically significant.  Final Clinical Impressions(s) / ED Diagnoses   Final diagnoses:  Paralytic ileus ([place])  Hyponatremia  Hypokalemia  Increased ammonia level    New Prescriptions New Prescriptions   No medications on file   I personally performed the services described in this documentation, which was scribed in my presence. The recorded information has been reviewed and is accurate.       Delora Fuel, MD [id number]

## 2016-04-05 NOTE — ED Notes (Addendum)
Critical lab value Na+ 720, Dr. Roxanne Mins made aware.

## 2016-04-05 NOTE — Progress Notes (Signed)
Palliative Medicine RN Note: Palliative Medicine Consult noted. Pt is already active with [facility]. Their liason will come meet with the pt and family to discuss hospice options and to eval for GIP status. PMT will monitor chart and engage after that meeting if needed.  Marjie Skiff Blondell Laperle, RN, BSN, [facility] 04/05/2016 9:35 AM Cell [phone] 8:00-4:00 Monday-Friday Office [phone]

## 2016-04-05 NOTE — H&P (Addendum)
History and Physical    Russell Dudley UKG:[id number] DOB: 11-14-1945 DOA: 04/05/2016  I have briefly reviewed the patient's prior medical records in [place]  PCP: Wyatt Haste, MD  Patient coming from: Home  Chief Complaint: Abdominal pain with emesis  HPI: Russell Dudley is a 71 y.o. male with medical history significant of Atypical meningioma of brain, HTN, iatrogenic adrenal insufficiency, and seizures who presented to the ED complaining of acute onset of moderate abdominal pain x 3 days. Patient doesn't give much history. His wife also reports abdominal swelling, nausea, and vomiting.The patient has had decrease in appetite over the last 2 weeks and has had minimal fluid intake over the last few days with decrease in urine out put. The wife also recalls trouble with diarrhea a few times over the last month with a severe episode on Sunday. She gave the patient imodium and the diarrhea cleared up. No other alleviating factors noted. He has had no recent abdominal surgeries.   ED Course: In the emergency room patient's vital signs were relatively stable, his blood pressure was initially normal but then he become slightly hypotensive, his BMP was significant for a sodium of 115, and CBC shows a mild leukocytosis with a white count of 14.  TRH was asked for admission for hyponatremia  ED meds sodium chloride 0.9 % bolus 1,000 mL  Ondansetron 4 mg IV Potassium Chloride 10 mEq in 100 mL IVPB Morphine 4 mg IV   Review of Systems: Unable to obtain review of systems due to lethargy  Past Medical History:  Diagnosis Date  . Allergic rhinitis   . ASHD (arteriosclerotic heart disease)   . Cancer [facility])    brain tumor; "atypical, grade 2" (02/14/2016)  . Dyslipidemia   . History of blood transfusion 2006   "during OR"  . History of meningioma   . Hypertension   . Seizures ([place])    "partial; on daily RX; last sz was in 2017" (02/14/2016)    Past Surgical History:    Procedure Laterality Date  . BRAIN MENINGIOMA EXCISION  2006; 2011  . COLONOSCOPY  2008   Dr. Carlean Purl  . FRACTURE SURGERY    . NASAL FRACTURE SURGERY    . NASAL SINUS SURGERY     "related to broken nose"  . TONSILLECTOMY AND ADENOIDECTOMY  1950s     reports that he has never smoked. He has never used smokeless tobacco. He reports that he drinks alcohol. He reports that he does not use drugs.  Allergies  Allergen Reactions  . Metoclopramide Hcl     Muscle spasms     History reviewed. No pertinent family history.  Prior to Admission medications   Medication Sig Start Date End Date Taking? Authorizing Provider  amLODipine (NORVASC) 5 MG tablet TAKE 1 TABLET BY MOUTH  DAILY 02/07/16  Yes Denita Lung, MD  clobazam (ONFI) 10 MG tablet Take 15 mg by mouth daily.   Yes Historical Provider, MD  clonazePAM (KLONOPIN) 0.5 MG tablet Take 0.5 mg by mouth 2 (two) times daily as needed for anxiety.    Yes Historical Provider, MD  dexamethasone (DECADRON) 4 MG tablet Take 1 tablet (4 mg total) by mouth 2 (two) times daily with a meal. X 7 days then 4 mg daily x 7 days then return to prior dose Patient taking differently: Take 4 mg by mouth 2 (two) times daily with a meal.  02/15/16  Yes Kaleesi Guyton Karlyne Greenspan, MD  haloperidol (HALDOL) 2  MG tablet Take 4 mg by mouth every 4 (four) hours as needed for agitation.   Yes Historical Provider, MD  hydrocortisone (CORTEF) 5 MG tablet Take 15 mg by mouth 2 (two) times daily.   Yes Historical Provider, MD  lisinopril (PRINIVIL,ZESTRIL) 20 MG tablet TAKE 1 TABLET BY MOUTH  DAILY 12/23/15  Yes Denita Lung, MD  metoprolol succinate (TOPROL-XL) 50 MG 24 hr tablet Take 1 tablet (50 mg total) by mouth daily. Take with or immediately following a meal. 01/21/16  Yes Denita Lung, MD  ondansetron (ZOFRAN) 8 MG tablet Take 8 mg by mouth every 8 (eight) hours as needed for nausea or vomiting.   Yes Historical Provider, MD  sertraline (ZOLOFT) 50 MG tablet Take 50 mg by  mouth at bedtime.   Yes Historical Provider, MD  zonisamide (ZONEGRAN) 100 MG capsule Take 300 mg by mouth at bedtime.    Yes Historical Provider, MD  [facility]. Last treatment 01-21-16. Followed by Dr Encarnacion Slates:    Historical Provider, MD    Physical Exam: Vitals:   04/05/16 0416 04/05/16 0420 04/05/16 0503 04/05/16 0636  BP:  131/85 127/86 117/79  Pulse: 80 74 73 73  Resp: 16 14 18 14   Temp:      TempSrc:      SpO2: 94% 95% 98% 96%  Weight:      Height:          Constitutional: NAD, calm, comfortable, ill appearing Vitals:   04/05/16 0416 04/05/16 0420 04/05/16 0503 04/05/16 0636  BP:  131/85 127/86 117/79  Pulse: 80 74 73 73  Resp: 16 14 18 14   Temp:      TempSrc:      SpO2: 94% 95% 98% 96%  Weight:      Height:       Eyes: PERRL, lids and conjunctivae normal ENMT: Mucous membranes are somewhat dry.   Neck: normal, supple, no masses, no thyromegaly Respiratory: clear to auscultation bilaterally, no wheezing, no crackles. Normal respiratory effort. No accessory muscle use.  Cardiovascular: Regular rate and rhythm, no murmurs / rubs / gallops. 1+ LE edema, worse on left. 2+ pedal pulses.  Abdomen: mild tenderness to palpation, positive abdominal distension, no masses palpated. Bowel sounds high pitched and tinkling.  Musculoskeletal: no clubbing / cyanosis.  Skin: no rashes, lesions, ulcers. No induration Neurologic: Alert. CN 2-12 grossly intact.   Psychiatric: Arousable but somnolent otherwise. Normal mood.   Labs on Admission: I have personally reviewed following labs and imaging studies  CBC:  Recent Labs Lab 04/05/16 0309  WBC 14.7*  NEUTROABS 11.6*  HGB 15.9  HCT 43.9  MCV 97.1  PLT 423   Basic Metabolic Panel:  Recent Labs Lab 04/05/16 0233  NA 115*  K 3.0*  CL 85*  CO2 19*  GLUCOSE 104*  BUN 17  CREATININE 0.70  CALCIUM 8.3*   GFR: Estimated Creatinine Clearance: 96.8 mL/min (by C-G formula based on  SCr of 0.7 mg/dL). Liver Function Tests:  Recent Labs Lab 04/05/16 0233  AST 18  ALT 17  ALKPHOS 48  BILITOT 1.0  PROT 5.0*  ALBUMIN 2.4*    Recent Labs Lab 04/05/16 0233  LIPASE 15    Recent Labs Lab 04/05/16 0211  AMMONIA 71*   Coagulation Profile: No results for input(s): INR, PROTIME in the last 168 hours. Cardiac Enzymes: No results for input(s): CKTOTAL, CKMB, CKMBINDEX, TROPONINI in the last 168 hours. BNP (last 3 results) No results  for input(s): PROBNP in the last 8760 hours. HbA1C: No results for input(s): HGBA1C in the last 72 hours. CBG: No results for input(s): GLUCAP in the last 168 hours. Lipid Profile: No results for input(s): CHOL, HDL, LDLCALC, TRIG, CHOLHDL, LDLDIRECT in the last 72 hours. Thyroid Function Tests: No results for input(s): TSH, T4TOTAL, FREET4, T3FREE, THYROIDAB in the last 72 hours. Anemia Panel: No results for input(s): VITAMINB12, FOLATE, FERRITIN, TIBC, IRON, RETICCTPCT in the last 72 hours. Urine analysis:    Component Value Date/Time   COLORURINE YELLOW 04/05/2016 0217   APPEARANCEUR CLEAR 04/05/2016 0217   LABSPEC 1.018 04/05/2016 0217   PHURINE 5.0 04/05/2016 0217   GLUCOSEU 50 (A) 04/05/2016 0217   HGBUR SMALL (A) 04/05/2016 0217   BILIRUBINUR NEGATIVE 04/05/2016 0217   BILIRUBINUR N 05/18/2011 0845   KETONESUR NEGATIVE 04/05/2016 0217   PROTEINUR 100 (A) 04/05/2016 0217   UROBILINOGEN negative 05/18/2011 0845   UROBILINOGEN 0.2 12/18/2009 1952   NITRITE NEGATIVE 04/05/2016 0217   LEUKOCYTESUR NEGATIVE 04/05/2016 0217     Radiological Exams on Admission: Ct Abdomen Pelvis W Contrast  Result Date: 04/05/2016 CLINICAL DATA:  Acute onset of generalized abdominal pain and swelling. Decreased urinary output. Diarrhea. Nausea and vomiting. Initial encounter. EXAM: CT ABDOMEN AND PELVIS WITH CONTRAST TECHNIQUE: Multidetector CT imaging of the abdomen and pelvis was performed using the standard protocol following bolus  administration of intravenous contrast. CONTRAST:  112mL ISOVUE-300 IOPAMIDOL (ISOVUE-300) INJECTION 61% COMPARISON:  None. FINDINGS: Lower chest: A trace right pleural effusion is noted. Mild bibasilar atelectasis is noted. Mild wall thickening at the distal esophagus may reflect chronic inflammation or possibly mild esophagitis. Hepatobiliary: The liver is grossly unremarkable in appearance. The gallbladder is within normal limits. There is interposition of the hepatic flexure of the colon anterior to the liver. The common bile duct remains normal in caliber. Pancreas: The pancreas is within normal limits. Spleen: The spleen is unremarkable in appearance. Adrenals/Urinary Tract: The adrenal glands are unremarkable in appearance. The kidneys are within normal limits. There is no evidence of hydronephrosis. No renal or ureteral stones are identified. No perinephric stranding is seen. Stomach/Bowel: The stomach is unremarkable in appearance. The small bowel is largely filled with fluid and air. It is mostly borderline normal in caliber, though it appears to clump at the right lower quadrant, possibly reflecting mild small bowel dysmotility due to an underlying adhesion. Mild associated free fluid is noted. The colon is distended to 8 cm in maximal diameter, raising concern for underlying ileus. Fluid and air tracks throughout the colon to the level of the rectum. Vascular/Lymphatic: Scattered calcification is seen along the abdominal aorta and its branches. The abdominal aorta is otherwise grossly unremarkable. The inferior vena cava is grossly unremarkable. No retroperitoneal lymphadenopathy is seen. No pelvic sidewall lymphadenopathy is identified. Reproductive: The bladder is decompressed and not well assessed. The prostate remains normal in size. Other: Mild soft tissue edema is seen tracking along the lateral abdominal and pelvic wall bilaterally. Musculoskeletal: No acute osseous abnormalities are identified.  The visualized musculature is unremarkable in appearance. IMPRESSION: 1. Distention of the colon to 8 cm in maximal diameter, raising concern for underlying ileus. Fluid and air noted tracking throughout the colon to the level of the rectum. 2. Small bowel is largely filled with fluid and air. It appears to lump at the right lower quadrant, possibly reflecting mild small bowel dysmotility due to an underlying adhesion. No evidence of bowel obstruction. Mild associated free fluid noted. 3.  Trace right pleural effusion.  Bibasilar atelectasis noted. 4. Scattered aortic atherosclerosis. 5. Mild wall thickening at the distal esophagus may reflect chronic inflammation or possibly mild esophagitis. 6. Mild soft tissue edema along the lateral abdominal and pelvic wall bilaterally. Electronically Signed   By: Garald Balding M.D.   On: 04/05/2016 04:17    Assessment/Plan Active Problems:   Hypertension   Atypical meningioma of brain (HCC)   Iatrogenic adrenal insufficiency (HCC)   Abnormality of gait   N&V (nausea and vomiting)   Hyponatremia  Ileus with nausea and vomiting. Acute. CT showed 8 cm distended colon with concern for ileus, no evidence of bowel obstruction. Unclear etiology. History of diarrhea on Sunday with imodium use, history of N/V and elevated WBC on admission raises concern for gastroenteritis as possible cause. May also be complicated by immobility. Continue IV fluids, lactulose enema today. Continue ondansetron for nausea prn and morphine for pain prn.  Hyponatremia. Likely due to decreased food and fluid intake. Continue IV fluids and monitor.   Hypokalemia. Likely due to diarrhea and vomiting. Now lower level of normal, 3.5, after supplement given in ED. Give 30 mEq potassium today. Continue to monitor.   Elevated ammonia. Unclear etiology. CT shows an unremarkable liver. Lactulose enema today. Continue to monitor.   Non-anion gap metabolic acidosis. Bicarb now up to 20. Likely due  to dehydration with history of diarrhea and decreased intake. Continue IV fluids and monitor.   Hypertension. Patient is progressively hypotensive since arriving to ED. Continue IV fluids.  Hold antihypertensives medications.  Stress dose steroids  Atypical meningioma of brain. Diagnosed in 2015. Report of tumor growing in January 2018 on MRI. Patient no longer receiving chemotherapy and is now on hospice care.   Iatrogenic adrenal insufficiency. Due to long term use of steroid for suppression of swelling in the brain.   Seizures. No acute episode. Continue zonisamide.     DVT prophylaxis: SCD's  Code Status: DNR  Family Communication: Spoke with wife at bedside Disposition Plan: tbd Consults called: Palliative     Admission status: inpatient    At the time of admission, it appears that the appropriate admission status for this patient is INPATIENT. This is judged to be reasonable and necessary in order to provide the required high service intensity to ensure the patient's safety given the presenting symptoms, physical exam findings, and initial radiographic and laboratory data in the context of their chronic comorbidities. Current circumstances are felt to place patient at high risk for further clinical deterioration threatening life, limb, or organ. Moreover, it is my clinical judgment that the patient will require inpatient hospital care spanning beyond 2 midnights from the point of admission and that early discharge would result in unnecessary risk of decompensation and readmission or threat to life, limb or bodily function.   Aliene Altes PA-S [facility] 04/05/2016  Attending MD note  Patient was seen, examined,treatment plan was discussed with the PA-S Aliene Altes.  I have personally reviewed the clinical findings, lab, imaging studies and management of this patient in detail. I agree with the documentation, as recorded by the PA-S.   Patient is 71 year old gentleman  with history of recurrent meningioma currently on no therapy and on home hospice, who is being brought to the hospital by his wife for progressive decline over the last week, decreased appetite as well as a transient diarrheal illness about 3-4 days ago.  He appears lethargic in the ED, and history is obtained from his wife who  is at bedside. She reports several diarrheal episodes over the weekend, she gave patient imodium and these have resolved. There are no reported fever/chills. His appetite has been quite poor and hasn't been able to eat or drink much in the past 2 days.   On Exam: Gen. exam: ill appearing, pale Chest: Good air entry bilaterally, no rhonchi or rales CVS: S1-S2 regular, no murmurs Abdomen: distended, no reported tenderness son exam, BS+ Neurology: LE weakness, chronic Skin: No rash or lesions  Plan  Ileus with nausea and vomiting - ? Related to GI illness few days ago, supportive treatment for now, enema, NPO, advance diet as tolerated  Acute encephalopathy - likely due to known extensive intracranial tumor / known edema which is likely to have progressed - ammonia is elevated which is of unclear significance, no liver cirrhosis on CT, but can contribute, will give lactulose enema  Hyponatremia - due to dehydration, improving with fluids. Decrease rate from 125 cc/h to 100 cc/h to prevenr over rapid correction - BMP Q4h  Hypokalemia  - Likely due to GI losses. Replete and monitor  Non-anion gap metabolic acidosis  - due to GI losses, IVF  Hypotension - likely related to dehydration, has a component of adrenal insufficiency.  - stress dose steroids  Atypical meningioma of brain.  - Diagnosed in 2015. No longer receiving chemotherapy and is now on hospice care.   Iatrogenic adrenal insufficiency  Seizures - Continue home [facility]  GOC  - briefly d/w wife at bedside, he appears that he may be dying soon, will attempt to help with his Na and GI issues but  may need his care to be transitioned to comfort soon. He has no appetite and his po intake is minimal. May qualify for residential hospice as well. Have asked palliative to see.   Addendum 11am - called by RN that hospice liason d/w further with patient's wife, and she wants patient's care to be transitioned towards comfort.  I discussed with wife in person myself, she does not wish any aggressive measures, no IV fluids, she understands that death is imminent and wishes for her husband to pass comfortably and transition his care towards for comfort only.  Discussed about fluids medications, will discontinue and anticipate in-hospital death versus residential hospice if he survives over the next 24-48 hours.  Marzetta Board, MD [facility] Pager [phone]  If 7PM-7AM, please contact night-coverage [email or web address] Password [facility]  04/05/2016, 8:27 AM

## 2016-04-12 ENCOUNTER — Telehealth: Payer: Self-pay | Admitting: Family Medicine

## 2016-04-12 NOTE — Telephone Encounter (Signed)
Sympathy card sent 

## 2016-04-16 DEATH — deceased

## 2017-03-17 IMAGING — MR MR HEAD WO/W CM
10 of 13 series · 32 of 48 positions shown · IV contrast (multihance)
Comparison: 02/13/2016 CT of the head. 04/17/2007 MRI of the brain.

CLINICAL DATA: 70 y/o M; history recurrent atypical meningioma with
2 prior resections, gamma knife radiation, and currently on
chemotherapy presenting with severe intractable nausea and vomiting.

EXAM:
MRI HEAD WITHOUT AND WITH CONTRAST
TECHNIQUE: Multiplanar, multiecho pulse sequences of the brain and surrounding
structures were obtained without and with intravenous contrast.
CONTRAST:  15mL MULTIHANCE GADOBENATE DIMEGLUMINE 529 MG/ML IV SOLN

[Series 4: DWI · axial · 3.0mm · 1.09mm/px · z∈[-2,+158]mm · 9 of 110 slices shown (1 of 4)]
[im 1/110]
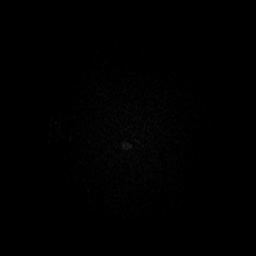
[im 14/110]
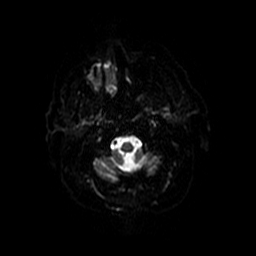
[im 28/110]
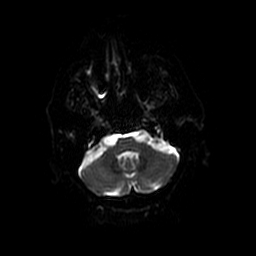
[im 41/110]
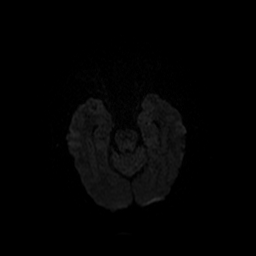
[im 55/110]
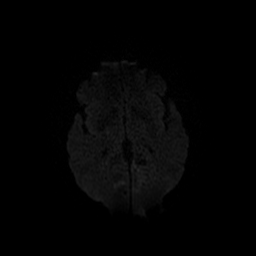
[im 69/110]
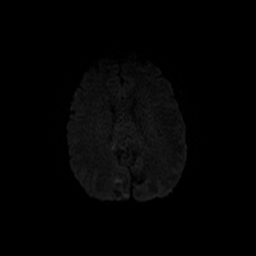
[im 82/110]
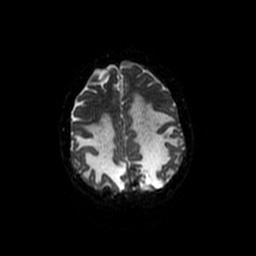
[im 96/110]
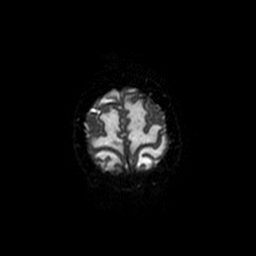
[im 110/110]
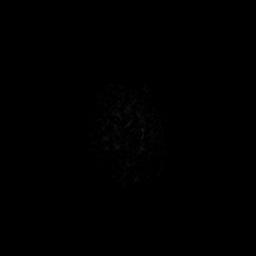

[Series 5: T1 · sagittal · 5.0mm · 0.47mm/px · 1 of 26 slices shown]
[im 1/26]
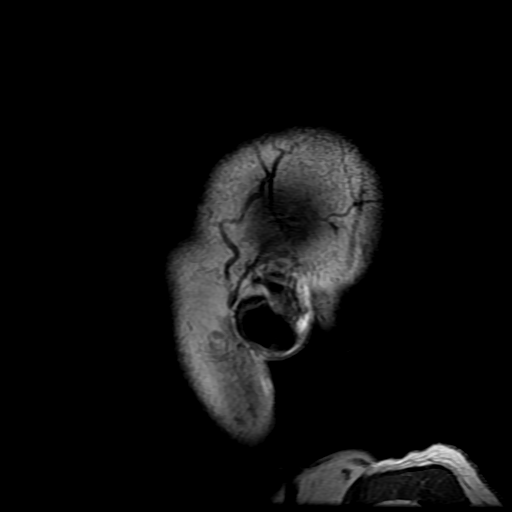

[Series 6: T2 · axial · 5.0mm · 0.43mm/px · z∈[-5,+155]mm · 2 of 28 slices shown]
[im 1/28]
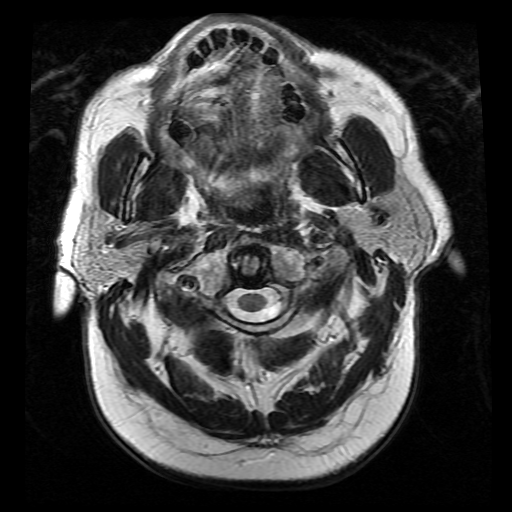
[im 28/28]
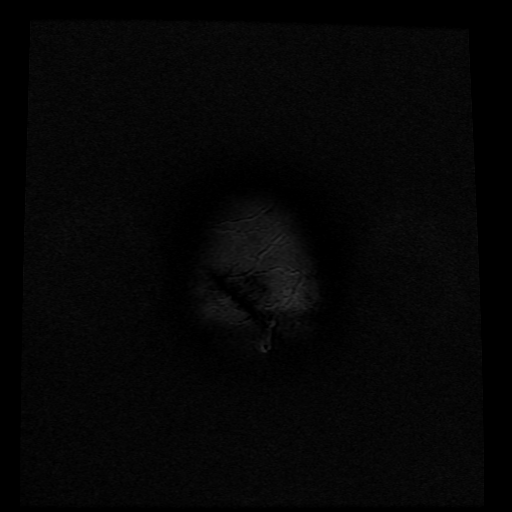

[Series 7: DWI · coronal · 5.0mm · 1.09mm/px · 5 of 72 slices shown (2 of 4)]
[im 1/72]
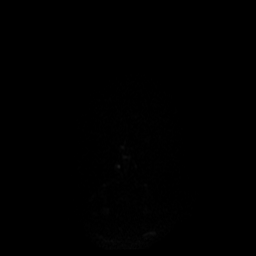
[im 18/72]
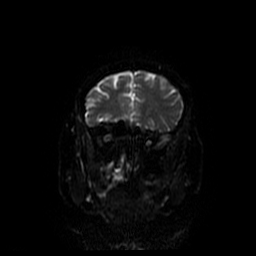
[im 36/72]
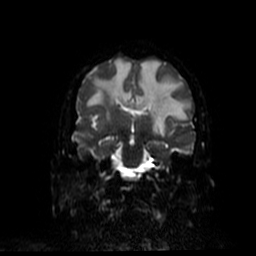
[im 54/72]
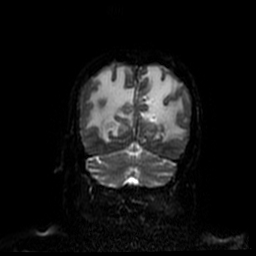
[im 72/72]
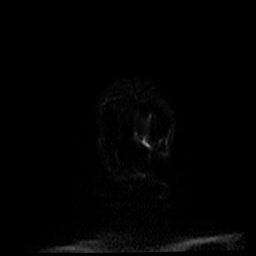

[Series 8: FLAIR · axial · 5.0mm · 0.43mm/px · z∈[-5,+155]mm · 2 of 28 slices shown]
[im 1/28]
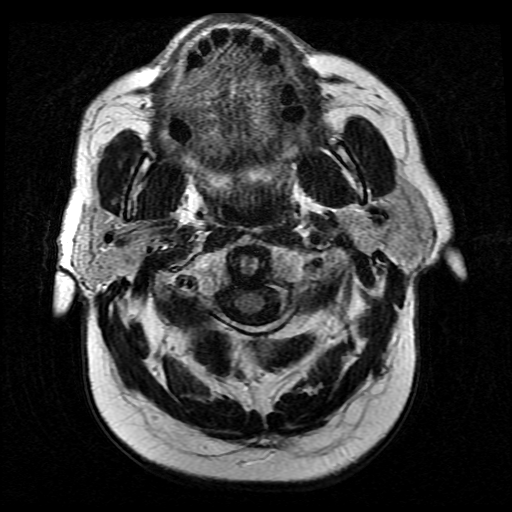
[im 28/28]
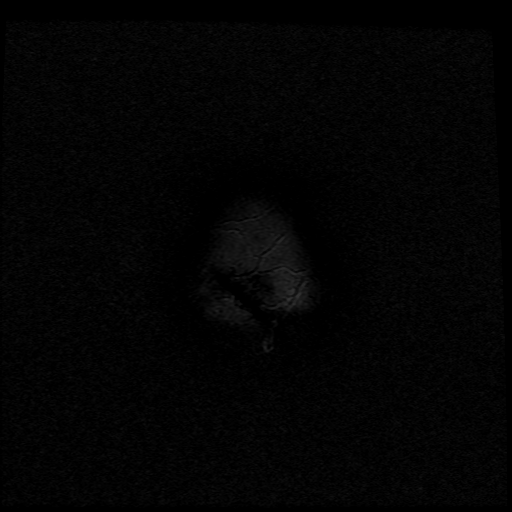

[Series 11: T2 post-contrast · coronal · 5.0mm · 0.39mm/px · 2 of 28 slices shown]
[im 1/28]
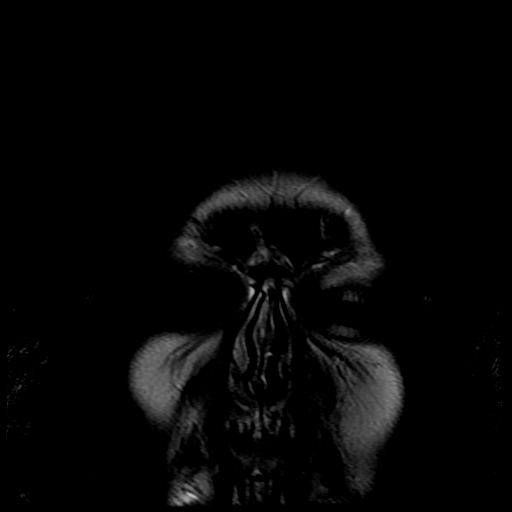
[im 28/28]
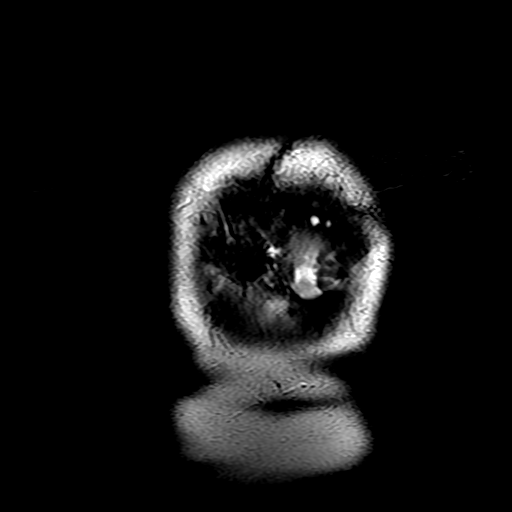

[Series 13: T1 post-contrast · coronal · 5.0mm · 0.39mm/px · 2 of 28 slices shown (1 of 2)]
[im 1/28]
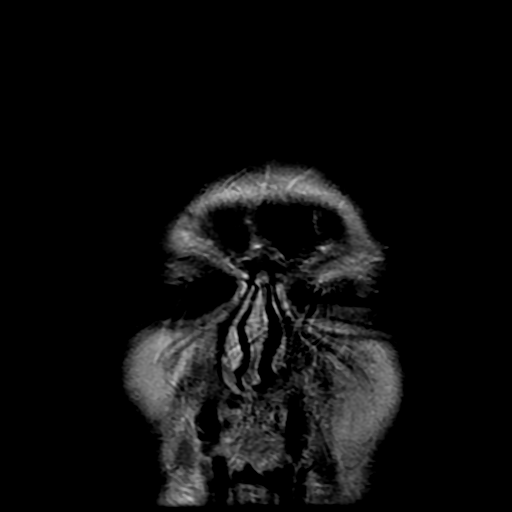
[im 28/28]
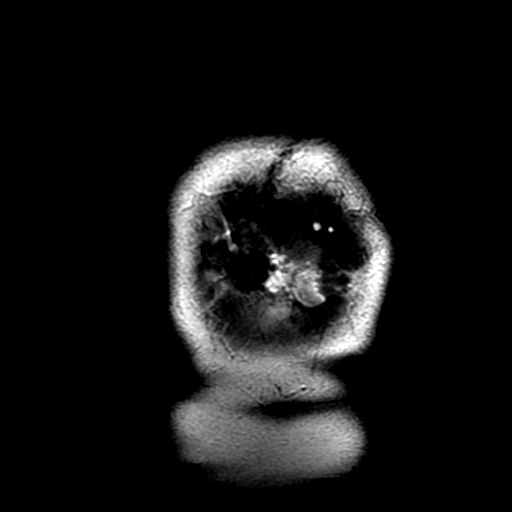

[Series 14: T1 post-contrast · sagittal · 5.0mm · 0.47mm/px · 2 of 26 slices shown (2 of 2)]
[im 1/26]
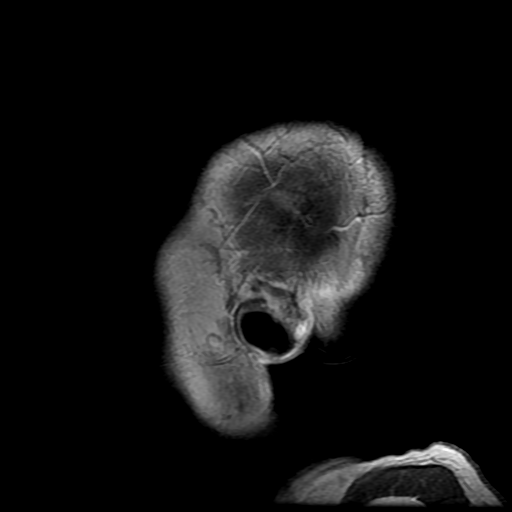
[im 26/26]
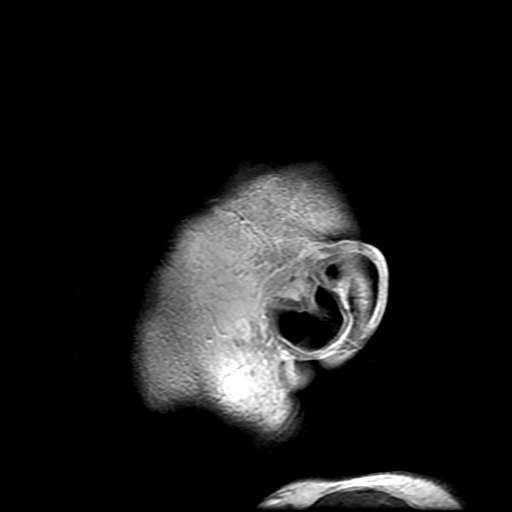

[Series 400: DWI · axial · 3.0mm · 1.09mm/px · z∈[-2,+158]mm · 4 of 55 slices shown (3 of 4)]
[im 1/55]
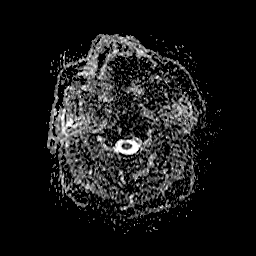
[im 19/55]
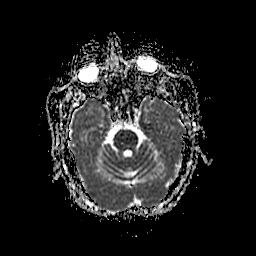
[im 37/55]
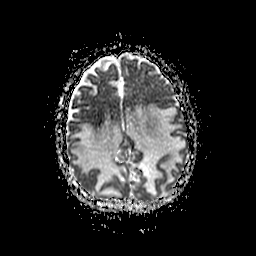
[im 55/55]
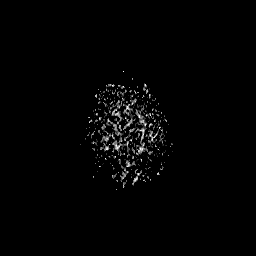

[Series 700: DWI · coronal · 5.0mm · 1.09mm/px · 3 of 36 slices shown (4 of 4)]
[im 1/36]
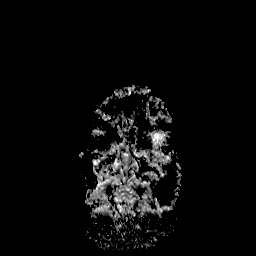
[im 18/36]
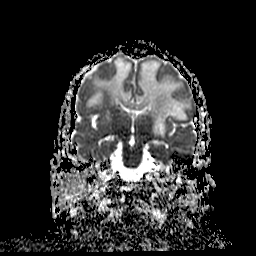
[im 36/36]
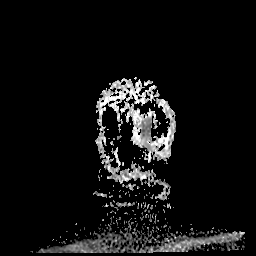

[32 of 48 positions shown; findings below may reference images not displayed]

FINDINGS: Brain: Irregular enhancing plaque-like mass, consistent with history
of atypical meningioma, centered along the posterior falx in the
occipital, parietal, and posterior frontal regions within
ill-defined margin with the adjacent brain throughout indicating
probable brain parenchymal invasion. Additionally, there is
thickening of the falx extending anteriorly to the mid frontal
region with adjacent sulcal enhancement which may represent local
leptomeningeal spread of disease (series 12, image 44 and 41). In
conglomerate the mass covers an area measuring 7.3 x 3.7 x 8.0 cm
(AP x ML x CC) series 12, image 31 and series 14, image 15.
Susceptibility blooming within the mass probably represents
mineralization.

There is diffuse T2 FLAIR hyperintense signal abnormality extending
throughout the bilateral occipital, parietal, posterior frontal, and
posterior temporal lobes as well as the bilateral insula greater on
the left consistent with vasogenic edema. There is mild mass effect
with sulcal effacement as well as partial effacement of the
occipital horns of lateral ventricles. No downward herniation.

No diffusion restriction of the brain to suggest acute or early
subacute infarct. No evidence for acute hemorrhage.

Vascular: Normal flow voids.

Skull and upper cervical spine: Postsurgical changes related to
posterior paramedian parietal and occipital craniotomies.

Sinuses/Orbits: Moderate mucosal thickening of the right maxillary
sinus and mild mucosal thickening of ethmoid air cells. Otherwise no
abnormal signal of paranasal sinuses or mastoid air cells.

Other: None.
IMPRESSION: 1. Irregular enhancing plaque-like mass, consistent with history of
atypical meningioma, centered along the posterior falx in the
occipital, parietal, and posterior frontal regions. This is
increased in size and severely from 8117. Comparison with recent
outside imaging were available can assess interval change.
2. Ill-defined margin of the mass to the brain indicating probable
brain parenchymal invasion
3. Thickening of the falx extending anteriorly to the mid frontal
region with adjacent sulcal enhancement may represent local
leptomeningeal spread of disease.
4. Severe diffuse vasogenic edema throughout the left greater than
right posterior brain.
5. Mass effect with sulcal effacement and lateral ventricle
occipital horn effacement. No herniation.
6. No evidence of acute/early subacute infarct or acute hemorrhage
in the brain.

By: Justin Huq M.D.
